# Patient Record
Sex: Female | Born: 1975
Health system: Southern US, Community
[De-identification: ages and names within clinical notes are randomized; demographics above are authoritative.]

## PROBLEM LIST (undated history)

## (undated) DIAGNOSIS — G43909 Migraine, unspecified, not intractable, without status migrainosus: Secondary | ICD-10-CM

## (undated) DIAGNOSIS — D649 Anemia, unspecified: Secondary | ICD-10-CM

## (undated) HISTORY — DX: Anemia, unspecified: D64.9

## (undated) HISTORY — DX: Migraine, unspecified, not intractable, without status migrainosus: G43.909

---

## 2011-01-18 ENCOUNTER — Ambulatory Visit (INDEPENDENT_AMBULATORY_CARE_PROVIDER_SITE_OTHER): Payer: Managed Care, Other (non HMO)

## 2011-01-18 DIAGNOSIS — R059 Cough, unspecified: Secondary | ICD-10-CM

## 2011-01-18 DIAGNOSIS — J029 Acute pharyngitis, unspecified: Secondary | ICD-10-CM

## 2011-01-18 DIAGNOSIS — R21 Rash and other nonspecific skin eruption: Secondary | ICD-10-CM

## 2011-01-18 DIAGNOSIS — R05 Cough: Secondary | ICD-10-CM

## 2011-10-10 ENCOUNTER — Telehealth: Payer: Self-pay | Admitting: Gynecology

## 2011-10-10 ENCOUNTER — Encounter: Payer: Self-pay | Admitting: Women's Health

## 2011-10-10 ENCOUNTER — Other Ambulatory Visit (HOSPITAL_COMMUNITY)
Admission: RE | Admit: 2011-10-10 | Discharge: 2011-10-10 | Disposition: A | Discharge status: Home / Self Care | Payer: Managed Care, Other (non HMO) | Source: Ambulatory Visit | Attending: Obstetrics and Gynecology | Admitting: Obstetrics and Gynecology

## 2011-10-10 ENCOUNTER — Other Ambulatory Visit: Payer: Self-pay | Admitting: Gynecology

## 2011-10-10 ENCOUNTER — Ambulatory Visit (INDEPENDENT_AMBULATORY_CARE_PROVIDER_SITE_OTHER): Payer: Managed Care, Other (non HMO) | Admitting: Women's Health

## 2011-10-10 VITALS — BP 122/72 | Ht 65.0 in | Wt 137.0 lb

## 2011-10-10 DIAGNOSIS — Z113 Encounter for screening for infections with a predominantly sexual mode of transmission: Secondary | ICD-10-CM

## 2011-10-10 DIAGNOSIS — Z3049 Encounter for surveillance of other contraceptives: Secondary | ICD-10-CM

## 2011-10-10 DIAGNOSIS — Z13220 Encounter for screening for lipoid disorders: Secondary | ICD-10-CM

## 2011-10-10 DIAGNOSIS — Z833 Family history of diabetes mellitus: Secondary | ICD-10-CM

## 2011-10-10 DIAGNOSIS — Z01419 Encounter for gynecological examination (general) (routine) without abnormal findings: Secondary | ICD-10-CM | POA: Insufficient documentation

## 2011-10-10 LAB — CBC WITH DIFFERENTIAL/PLATELET
Lymphocytes Relative: 36 % (ref 12–46)
Lymphs Abs: 1.7 10*3/uL (ref 0.7–4.0)
Neutrophils Relative %: 52 % (ref 43–77)
Platelets: 288 10*3/uL (ref 150–400)
RBC: 4.7 MIL/uL (ref 3.87–5.11)
WBC: 4.7 10*3/uL (ref 4.0–10.5)

## 2011-10-10 LAB — LIPID PANEL
HDL: 72 mg/dL (ref >39)
Triglycerides: 49 mg/dL (ref <150)

## 2011-10-10 LAB — GLUCOSE, RANDOM: Glucose, Bld: 75 mg/dL (ref 70–99)

## 2011-10-10 LAB — HIV ANTIBODY (ROUTINE TESTING W REFLEX): HIV: NONREACTIVE

## 2011-10-10 MED ORDER — LEVONORGESTREL 20 MCG/24HR IU IUD: INTRAUTERINE_SYSTEM | Freq: Once | INTRAUTERINE | Status: DC

## 2011-10-10 NOTE — Telephone Encounter (Signed)
MIRENA BENEFITS:Patient was advised that Mirena & insertion are both covered at 100% with no copay. She already has appt with JF/[facility]

## 2011-10-10 NOTE — Progress Notes (Signed)
Mary Hamilton January 14, 1976 [id number]    History:    The patient presents for annual exam.  Monthly cycle for 5 days/condoms. One partner for 6 months. Complained of headaches when used OCPs in the past. Requesting contraception. New patient, reports normal Pap history.   Past medical history, past surgical history, family history and social history were all reviewed and documented in the EPIC chart. 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   ROS:  A  ROS was performed and pertinent positives and negatives are included in the history.  Exam:  Filed Vitals:   10/10/11 1019  BP: 122/72    General appearance:  Normal Head/Neck:  Normal, without cervical or supraclavicular adenopathy. Thyroid:  Symmetrical, normal in size, without palpable masses or nodularity. Respiratory  Effort:  Normal  Auscultation:  Clear without wheezing or rhonchi Cardiovascular  Auscultation:  Regular rate, without rubs, murmurs or gallops  Edema/varicosities:  Not grossly evident Abdominal  Soft,nontender, without masses, guarding or rebound.  Liver/spleen:  No organomegaly noted  Hernia:  None appreciated  Skin  Inspection:  Grossly normal  Palpation:  Grossly normal Neurologic/psychiatric  Orientation:  Normal with appropriate conversation.  Mood/affect:  Normal  Genitourinary    Breasts: Examined lying and sitting.     Right: Without masses, retractions, discharge or axillary adenopathy.     Left: Without masses, retractions, discharge or axillary adenopathy.   Inguinal/mons:  Normal without inguinal adenopathy  External genitalia:  Normal  BUS/Urethra/Skene's glands:  Normal  Bladder:  Normal  Vagina:  Normal  Cervix:  Normal  Uterus:  normal in size, shape and contour.  Midline and mobile  Adnexa/parametria:     Rt: Without masses or tenderness.   Lt: Without masses or tenderness.  Anus and perineum: Normal  Digital rectal exam: Normal sphincter tone without palpated  masses or tenderness  Assessment/Plan:  36 y.o. SBF G2 P1 for annual exam with no complaints.  Normal GYN exam Contraception management STD screen  Plan: CBC, glucose, lipid panel, UA, Pap with HR HPV typing, GC/Chlamydia,  HIV, hep B and C., RPR. SBE's, exercise, calcium rich diet, MVI daily encouraged. Contraception options reviewed, Mirena IUD information given reviewed slight risk for perforation, hemorrhage, infection. Will schedule with next cycle with Dr. Lily Peer. Encouraged condoms until Mirena IUD placed.   Harrington Challenger [facility], 12:39 PM 10/10/2011

## 2011-10-10 NOTE — Patient Instructions (Addendum)
Health Maintenance, Females A healthy lifestyle and preventative care can promote health and wellness.  Maintain regular health, dental, and eye exams.   Eat a healthy diet. Foods like vegetables, fruits, whole grains, low-fat dairy products, and lean protein foods contain the nutrients you need without too many calories. Decrease your intake of foods high in solid fats, added sugars, and salt. Get information about a proper diet from your caregiver, if necessary.   Regular physical exercise is one of the most important things you can do for your health. Most adults should get at least 150 minutes of moderate-intensity exercise (any activity that increases your heart rate and causes you to sweat) each week. In addition, most adults need muscle-strengthening exercises on 2 or more days a week.    Maintain a healthy weight. The body mass index (BMI) is a screening tool to identify possible weight problems. It provides an estimate of body fat based on height and weight. Your caregiver can help determine your BMI, and can help you achieve or maintain a healthy weight. For adults 20 years and older:   A BMI below 18.5 is considered underweight.   A BMI of 18.5 to 24.9 is normal.   A BMI of 25 to 29.9 is considered overweight.   A BMI of 30 and above is considered obese.   Maintain normal blood lipids and cholesterol by exercising and minimizing your intake of saturated fat. Eat a balanced diet with plenty of fruits and vegetables. Blood tests for lipids and cholesterol should begin at age 20 and be repeated every 5 years. If your lipid or cholesterol levels are high, you are over 50, or you are a high risk for heart disease, you may need your cholesterol levels checked more frequently.Ongoing high lipid and cholesterol levels should be treated with medicines if diet and exercise are not effective.   If you smoke, find out from your caregiver how to quit. If you do not use tobacco, do not start.    If you are pregnant, do not drink alcohol. If you are breastfeeding, be very cautious about drinking alcohol. If you are not pregnant and choose to drink alcohol, do not exceed 1 drink per day. One drink is considered to be 12 ounces (355 mL) of beer, 5 ounces (148 mL) of wine, or 1.5 ounces (44 mL) of liquor.   Avoid use of street drugs. Do not share needles with anyone. Ask for help if you need support or instructions about stopping the use of drugs.   High blood pressure causes heart disease and increases the risk of stroke. Blood pressure should be checked at least every 1 to 2 years. Ongoing high blood pressure should be treated with medicines, if weight loss and exercise are not effective.   If you are 55 to 34 years old, ask your caregiver if you should take aspirin to prevent strokes.   Diabetes screening involves taking a blood sample to check your fasting blood sugar level. This should be done once every 3 years, after age 45, if you are within normal weight and without risk factors for diabetes. Testing should be considered at a younger age or be carried out more frequently if you are overweight and have at least 1 risk factor for diabetes.   Breast cancer screening is essential preventative care for women. You should practice "breast self-awareness." This means understanding the normal appearance and feel of your breasts and may include breast self-examination. Any changes detected, no matter how   small, should be reported to a caregiver. Women in their 20s and 30s should have a clinical breast exam (CBE) by a caregiver as part of a regular health exam every 1 to 3 years. After age 40, women should have a CBE every year. Starting at age 40, women should consider having a mammogram (breast X-ray) every year. Women who have a family history of breast cancer should talk to their caregiver about genetic screening. Women at a high risk of breast cancer should talk to their caregiver about having  an MRI and a mammogram every year.   The Pap test is a screening test for cervical cancer. Women should have a Pap test starting at age 21. Between ages 21 and 29, Pap tests should be repeated every 2 years. Beginning at age 30, you should have a Pap test every 3 years as long as the past 3 Pap tests have been normal. If you had a hysterectomy for a problem that was not cancer or a condition that could lead to cancer, then you no longer need Pap tests. If you are between ages 65 and 70, and you have had normal Pap tests going back 10 years, you no longer need Pap tests. If you have had past treatment for cervical cancer or a condition that could lead to cancer, you need Pap tests and screening for cancer for at least 20 years after your treatment. If Pap tests have been discontinued, risk factors (such as a new sexual partner) need to be reassessed to determine if screening should be resumed. Some women have medical problems that increase the chance of getting cervical cancer. In these cases, your caregiver may recommend more frequent screening and Pap tests.   The human papillomavirus (HPV) test is an additional test that may be used for cervical cancer screening. The HPV test looks for the virus that can cause the cell changes on the cervix. The cells collected during the Pap test can be tested for HPV. The HPV test could be used to screen women aged 30 years and older, and should be used in women of any age who have unclear Pap test results. After the age of 30, women should have HPV testing at the same frequency as a Pap test.   Colorectal cancer can be detected and often prevented. Most routine colorectal cancer screening begins at the age of 50 and continues through age 75. However, your caregiver may recommend screening at an earlier age if you have risk factors for colon cancer. On a yearly basis, your caregiver may provide home test kits to check for hidden blood in the stool. Use of a small camera at  the end of a tube, to directly examine the colon (sigmoidoscopy or colonoscopy), can detect the earliest forms of colorectal cancer. Talk to your caregiver about this at age 50, when routine screening begins. Direct examination of the colon should be repeated every 5 to 10 years through age 75, unless early forms of pre-cancerous polyps or small growths are found.   Hepatitis C blood testing is recommended for all people born from 1945 through 1965 and any individual with known risks for hepatitis C.   Practice safe sex. Use condoms and avoid high-risk sexual practices to reduce the spread of sexually transmitted infections (STIs). Sexually active women aged 25 and younger should be checked for Chlamydia, which is a common sexually transmitted infection. Older women with new or multiple partners should also be tested for Chlamydia. Testing for other   STIs is recommended if you are sexually active and at increased risk.   Osteoporosis is a disease in which the bones lose minerals and strength with aging. This can result in serious bone fractures. The risk of osteoporosis can be identified using a bone density scan. Women ages 65 and over and women at risk for fractures or osteoporosis should discuss screening with their caregivers. Ask your caregiver whether you should be taking a calcium supplement or vitamin D to reduce the rate of osteoporosis.   Menopause can be associated with physical symptoms and risks. Hormone replacement therapy is available to decrease symptoms and risks. You should talk to your caregiver about whether hormone replacement therapy is right for you.   Use sunscreen with a sun protection factor (SPF) of 30 or greater. Apply sunscreen liberally and repeatedly throughout the day. You should seek shade when your shadow is shorter than you. Protect yourself by wearing long sleeves, pants, a wide-brimmed hat, and sunglasses year round, whenever you are outdoors.   Notify your caregiver  of new moles or changes in moles, especially if there is a change in shape or color. Also notify your caregiver if a mole is larger than the size of a pencil eraser.   Stay current with your immunizations.  Document Released: 07/22/2010 Document Revised: 12/26/2010 Document Reviewed: 07/22/2010 ExitCare Patient Information 2012 [employer].HPV Vaccine Questions and Answers WHAT IS HUMAN PAPILLOMAVIRUS (HPV)? HPV is a virus that can lead to cervical cancer; vulvar and vaginal cancers; penile cancer; anal cancer and genital warts (warts in the genital areas). More than 1 vaccine is available to help you or your child with protection against HPV. Your caregiver can talk to you about which one might give you the best protection. WHO SHOULD GET THIS VACCINE? The HPV vaccine is most effective when given before the onset of sexual activity.  This vaccine is recommended for girls 11 or 34 years of age. It can be given to girls as Mary Hamilton as 34 years old.   HPV vaccine can be given to males, 9 through 34 years of age, to reduce the likelihood of acquiring genital warts.   HPV vaccine can be given to males and females aged 9 through 26 years to prevent anal cancer.  HPV vaccine is not generally recommended after age 26, because most individuals have been exposed to the HPV virus by that age. HOW EFFECTIVE IS THIS VACCINE?  The vaccine is generally effective in preventing cervical; vulvar and vaginal cancers; penile cancer; anal cancer and genital warts caused by 4 types of HPV. The vaccine is less effective in those individuals who are already infected with HPV. This vaccine does not treat existing HPV, genital warts, pre-cancers or cancers. WILL SEXUALLY ACTIVE INDIVIDUALS BENEFIT FROM THE VACCINE? Sexually active individuals may still benefit from the vaccine but may get less benefit due to previous HPV exposure. HOW AND WHEN IS THE VACCINE ADMINISTERED? The vaccine is given in a series of 3  injections (shots) over a 6 month period in both males and females. The exact timing depends on which specific vaccine your caregiver recommends for you. IS THE HPV VACCINE SAFE?  The federal government has approved the HPV vaccine as safe and effective. This vaccine was tested in both males and females in many countries around the world. The most common side effect is soreness at the injection site. Since the drug became approved, there has been some concern about patients passing out after being vaccinated, which has   led to a recommendation of a 15 minute waiting period following vaccination. This practice may decrease the small risk of passing out. Additionally there is a rare risk of anaphylaxis (an allergic reaction) to the vaccine and a risk of a blood clot among individuals with specific risk factors for a blood clot. DOES THIS VACCINE CONTAIN THIMEROSAL OR MERCURY? No. There is no thimerosal or mercury in the HPV vaccine. It is made of proteins from the outer coat of the virus (HPV). There is no infectious material in this vaccine. WILL GIRLS/WOMEN WHO HAVE BEEN VACCINATED STILL NEED CERVICAL CANCER SCREENING? Yes. There are 3 reasons why women will still need regular cervical cancer screening. First, the vaccine will NOT provide protection against all types of HPV that cause cervical cancer. Vaccinated women will still be at risk for some cancers. Second, some women may not get all required doses of the vaccine (or they may not get them at the recommended times). Therefore, they may not get the vaccine's full benefits. Third, women may not get the full benefit of the vaccine if they receive it after they have already acquired any of the 4 types of HPV. WILL THE HPV VACCINE BE COVERED BY INSURANCE PLANS? While some insurance companies may cover the vaccine, others may not. Most large group insurance plans cover the costs of recommended vaccines. WHAT KIND OF GOVERNMENT PROGRAMS MAY BE AVAILABLE TO  COVER HPV VACCINE? Federal health programs such as Vaccines for Children (VFC) will cover the HPV vaccine. The VFC program provides free vaccines to children and adolescents under 19 years of age, who are either uninsured, Medicaid-eligible, [place] or [place]. There are over 45,000 sites that provide VFC vaccines including hospital, private and public clinics. The VFC program also allows children and adolescents to get VFC vaccines through Federally Qualified Health Centers or Rural Health Centers if their private health insurance does not cover the vaccine. Some states also provide free or low-cost vaccines, at public health clinics, to people without health insurance coverage for vaccines. GENITAL HPV: WHY IS HPV IMPORTANT? Genital HPV is the most common virus transmitted through genital contact, most often during vaginal and anal sex. About 40 types of HPV can infect the genital areas of men and women. While most HPV types cause no symptoms and go away on their own, some types can cause cervical cancer in women. These types also cause other less common genital cancers, including cancers of the penis, anus, vagina (birth canal), and vulva (area around the opening of the vagina). Other types of HPV can cause genital warts in men and women. HOW COMMON IS HPV?   At least 50% of sexually active people will get HPV at some time in their lives. HPV is most common in Mary Hamilton women and men who are in their late teens and early 20s.   Anyone who has ever had genital contact with another person can get HPV. Both men and women can get it and pass it on to their sex partners without realizing it.  IS HPV THE SAME THING AS HIV OR HERPES? HPV is NOT the same as HIV or Herpes (Herpes simplex virus or HSV). While these are all viruses that can be sexually transmitted, HIV and HSV do not cause the same symptoms or health problems as HPV. CAN HPV AND ITS ASSOCIATED DISEASES BE TREATED? There is no treatment  for HPV. There are treatments for the health problems that HPV can cause, such as genital warts, cervical   cell changes, and cancers of the cervix (lower part of the womb), vulva, vagina and anus.  HOW IS HPV RELATED TO CERVICAL CANCER? Some types of HPV can infect a woman's cervix and cause the cells to change in an abnormal way. Most of the time, HPV goes away on its own. When HPV is gone, the cervical cells go back to normal. Sometimes, HPV does not go away. Instead, it lingers (persists) and continues to change the cells on a woman's cervix. These cell changes can lead to cancer over time if they are not treated. ARE THERE OTHER WAYS TO PREVENT CERVICAL CANCER? Regular Pap tests and follow-up can prevent most, but not all, cases of cervical cancer. Pap tests can detect cell changes (or pre-cancers) in the cervix before they turn into cancer. Pap tests can also detect most, but not all, cervical cancers at an early, curable stage. Most women diagnosed with cervical cancer have either never had a Pap test, or not had a Pap test in the last 5 years. There is also an HPV DNA test available for use with the Pap test as part of cervical cancer screening. This test may be ordered for women over 30 or for women who get an unclear (borderline) Pap test result. While this test can tell if a woman has HPV on her cervix, it cannot tell which types of HPV she has. If the HPV DNA test is negative for HPV DNA, then screening may be done every 3 years. If the HPV DNA test is positive for HPV DNA, then screening should be done every 6 to 12 months. OTHER QUESTIONS ABOUT THE HPV VACCINE WHAT HPV TYPES DOES THE VACCINE PROTECT AGAINST? The HPV vaccine protects against the HPV types that cause most (70%) cervical cancers (types 16 and 18), most (78%) anal cancers (types 16 and 18) and the two HPV types that cause most (90%) genital warts (types 6 and 11). WHAT DOES THE VACCINE NOT PROTECT AGAINST?  Because the vaccine  does not protect against all types of HPV, it will not prevent all cases of cervical cancer, anal cancer, other genital cancers or genital warts. About 30% of cervical cancers are not prevented with vaccination, so it will be important for women to continue screening for cervical cancer (regular Pap tests). Also, the vaccine does not prevent about 10% of genital warts nor will it prevent other sexually transmitted infections (STIs), including HIV. Therefore, it will still be important for sexually active adults to practice safe sex to reduce exposure to HPV and other STI's. HOW LONG DOES VACCINE PROTECTION LAST? WILL A BOOSTER SHOT BE NEEDED? So far, studies have followed women for 5 years and found that they are still protected. Currently, additional (booster) doses are not recommended. More research is being done to find out how long protection will last, and if a booster vaccine is needed years later.  WHY IS THE HPV VACCINE RECOMMENDED AT SUCH A Mary Hamilton AGE? Ideally, males and females should get the vaccine before they are sexually active since this vaccine is most effective in individuals who have not yet acquired any of the HPV vaccine types. Individuals who have not been infected with any of the 4 types of HPV will get the full benefits of the vaccine.  SHOULD PREGNANT WOMEN BE VACCINATED? The vaccine is not recommended for pregnant women. There has been limited research looking at vaccine safety for pregnant women and their developing fetus. Studies suggest that the vaccine has not caused   health problems during pregnancy, nor has it caused health problems for the infant. Pregnant women should complete their pregnancy before getting the vaccine. If a woman finds out she is pregnant after she has started getting the vaccine series, she should complete her pregnancy before finishing the 3 doses. SHOULD BREASTFEEDING MOTHERS BE VACCINATED? Mothers nursing their babies may get the vaccine because the virus  is inactivated and will not harm the mother or baby. WILL INDIVIDUALS BE PROTECTED AGAINST HPV AND RELATED DISEASES, EVEN IF THEY DO NOT GET ALL 3 DOSES? It is not yet known how much protection individuals will get from receiving only 1 or 2 doses of the vaccine. For this reason, it is very important that individuals get all 3 doses of the vaccine. WILL CHILDREN BE REQUIRED TO BE VACCINATED TO ENTER SCHOOL? There are no federal laws that require children or adolescents to get vaccinated. All school entry laws are state laws so they vary from state to state. To find out what vaccines are needed for children or adolescents to enter school in your state, check with your state health department or board of education. ARE THERE OTHER WAYS TO PREVENT HPV? The only sure way to prevent HPV is to abstain from all sexual activity. Sexually active adults can reduce their risk by being in a mutually monogamous relationship with someone who has had no other sex partners. But even individuals with only 1 lifetime sex partner can get HPV, if their partner has had a previous partner with HPV. It is unknown how much protection condoms provide against HPV, since areas that are not covered by a condom can be exposed to the virus. However, condoms may reduce the risk of genital warts and cervical cancer. They can also reduce the risk of HIV and some other sexually transmitted infections (STIs), when used consistently and correctly (all the time and the right way). Document Released: 01/06/2005 Document Revised: 12/26/2010 Document Reviewed: 09/01/2008 ExitCare Patient Information 2012 [employer]. 

## 2011-10-11 LAB — URINALYSIS W MICROSCOPIC + REFLEX CULTURE
Casts: NONE SEEN
Glucose, UA: NEGATIVE mg/dL
Hgb urine dipstick: NEGATIVE
Ketones, ur: NEGATIVE mg/dL
Leukocytes, UA: NEGATIVE
pH: 6.5 (ref 5.0–8.0)

## 2011-10-11 LAB — GC/CHLAMYDIA PROBE AMP, GENITAL: Chlamydia, DNA Probe: NEGATIVE

## 2011-10-11 LAB — RPR

## 2011-10-21 ENCOUNTER — Ambulatory Visit: Payer: Managed Care, Other (non HMO) | Admitting: Gynecology

## 2011-10-22 ENCOUNTER — Encounter: Payer: Self-pay | Admitting: Gynecology

## 2011-10-22 ENCOUNTER — Other Ambulatory Visit: Payer: Self-pay | Admitting: Gynecology

## 2011-10-22 ENCOUNTER — Ambulatory Visit (INDEPENDENT_AMBULATORY_CARE_PROVIDER_SITE_OTHER): Payer: Managed Care, Other (non HMO) | Admitting: Gynecology

## 2011-10-22 VITALS — BP 110/70

## 2011-10-22 DIAGNOSIS — Z30430 Encounter for insertion of intrauterine contraceptive device: Secondary | ICD-10-CM

## 2011-10-22 DIAGNOSIS — G43909 Migraine, unspecified, not intractable, without status migrainosus: Secondary | ICD-10-CM

## 2011-10-22 DIAGNOSIS — Z3049 Encounter for surveillance of other contraceptives: Secondary | ICD-10-CM

## 2011-10-22 MED ORDER — ELETRIPTAN HYDROBROMIDE 40 MG PO TABS: ORAL_TABLET | ORAL | Status: DC

## 2011-10-22 NOTE — Patient Instructions (Signed)
Intrauterine Device Information  An intrauterine device (IUD) is inserted into your uterus and prevents pregnancy. There are 2 types of IUDs available:  · Copper IUD. This type of IUD is wrapped in copper wire and is placed inside the uterus. Copper makes the uterus and fallopian tubes produce a fluid that kills sperm. The copper IUD can stay in place for 10 years.  · Hormone IUD. This type of IUD contains the hormone progestin (synthetic progesterone). The hormone thickens the cervical mucus and prevents sperm from entering the uterus, and it also thins the uterine lining to prevent implantation of a fertilized egg. The hormone can weaken or kill the sperm that get into the uterus. The hormone IUD can stay in place for 5 years.  Your caregiver will make sure you are a good candidate for a contraceptive IUD. Discuss with your caregiver the possible side effects.  ADVANTAGES  · It is highly effective, reversible, long-acting, and low maintenance.  · There are no estrogen-related side effects.  · An IUD can be used when breastfeeding.  · It is not associated with weight gain.  · It works immediately after insertion.  · The copper IUD does not interfere with your female hormones.  · The progesterone IUD can make heavy menstrual periods lighter.  · The progesterone IUD can be used for 5 years.  · The copper IUD can be used for 10 years.  DISADVANTAGES  · The progesterone IUD can be associated with irregular bleeding patterns.  · The copper IUD can make your menstrual flow heavier and more painful.  · You may experience cramping and vaginal bleeding after insertion.  Document Released: 12/11/2003 Document Revised: 03/31/2011 Document Reviewed: 05/11/2010  ExitCare® Patient Information ©2013 [employer].

## 2011-10-22 NOTE — Progress Notes (Signed)
Patient is a 36 year old presented to the office today for placement of an IUD. Patient suffers from migraines and oral contraceptive pills exacerbate her headaches. She was originally scheduled for a Mirena IUD but after further discussion whether I think that she would benefit more from a nonhormonal IUD such as the ParaGard T380A IUD. Literature information was provided she read in the office the risks benefits and pros and cons were discussed. She is fully where this form of contraception is 99% effective and is good for 10 years. Patient a few weeks ago had her annual exam here in the office in her GC and Chlamydia culture and Pap smear were negative. Patient denies any prior history of STDs. She has one child.  Procedure note: Abdomen: Soft nontender no rebound or guarding Vagina: No lesions or discharge only menstrual blood was noted Cervix: Menstrual blood noted but no active bleeding Uterus: Anteverted normal size shape and consistency Adnexa: No palpable masses or tenderness Rectal exam: Not done  The cervix was cleansed with Betadine solution. A tenaculum was placed on the anterior cervical lip. The uterus sounded to 7 cm. The ParaGard T380A IUD was placed in sterile fashion and the string was cut the single-tooth tenaculum was removed. There was some slight oozing from one of the tenaculum sites which was contained with silver nitrate. Patient tolerated procedure well. Patient scheduled to return back in one month for followup. Patient was complaining of a migraine headache today she was given a sample of Relpax 40 mg to take 1 by mouth today. Prescriptions were e- prescribed.

## 2011-10-22 NOTE — Progress Notes (Signed)
Patient is in the office and Dr. Glenetta Hew has recommended Paraguard IUD instead of Mirena.  I was asked to check benefits.  I was quoted that Paraguard covered 100% after a $50 co-payment.  Patient had been previously told it was covered 100%.  I asked Elane Fritz to make the patient aware that she may receive bill for the co-payment amount.

## 2011-10-23 ENCOUNTER — Telehealth: Payer: Self-pay | Admitting: *Deleted

## 2011-10-23 MED ORDER — ELETRIPTAN HYDROBROMIDE 40 MG PO TABS: 40.0000 mg | ORAL_TABLET | ORAL | Status: DC | PRN

## 2011-10-23 NOTE — Telephone Encounter (Signed)
Pt called stating insurance will not pay for her brand relpax 40 mg tablet. Pt is requesting generic brand. rx sent.

## 2011-10-24 ENCOUNTER — Encounter: Payer: Self-pay | Admitting: Gynecology

## 2011-10-24 ENCOUNTER — Ambulatory Visit (INDEPENDENT_AMBULATORY_CARE_PROVIDER_SITE_OTHER): Payer: Managed Care, Other (non HMO)

## 2011-10-24 ENCOUNTER — Other Ambulatory Visit: Payer: Self-pay | Admitting: Gynecology

## 2011-10-24 ENCOUNTER — Ambulatory Visit (INDEPENDENT_AMBULATORY_CARE_PROVIDER_SITE_OTHER): Payer: Managed Care, Other (non HMO) | Admitting: Gynecology

## 2011-10-24 VITALS — BP 120/74

## 2011-10-24 DIAGNOSIS — N949 Unspecified condition associated with female genital organs and menstrual cycle: Secondary | ICD-10-CM

## 2011-10-24 DIAGNOSIS — R102 Pelvic and perineal pain: Secondary | ICD-10-CM

## 2011-10-24 DIAGNOSIS — N719 Inflammatory disease of uterus, unspecified: Secondary | ICD-10-CM | POA: Insufficient documentation

## 2011-10-24 DIAGNOSIS — Z975 Presence of (intrauterine) contraceptive device: Secondary | ICD-10-CM

## 2011-10-24 MED ORDER — DOXYCYCLINE HYCLATE 100 MG PO CAPS: 100.0000 mg | ORAL_CAPSULE | Freq: Two times a day (BID) | ORAL | Status: DC

## 2011-10-24 MED ORDER — SUMATRIPTAN SUCCINATE 50 MG PO TABS: ORAL_TABLET | ORAL | Status: DC

## 2011-10-24 NOTE — Progress Notes (Signed)
Patient is a 36 year old who was seen in the office on October 2 for placement of a para-guard T380A IUD.Patient suffers from migraines and oral contraceptive pills exacerbate her headaches. She was originally scheduled for a Mirena IUD but after further discussion whether I think that she would benefit more from a nonhormonal IUD such as the ParaGard T380A IUD. The IUD was placed without any problems it was during her menses. She stated that yesterday she was feeling fine but that today she started having a lot of cramping and right-sided lower, we'll discomfort. She was asked to come in for an ultrasound and examination and to confirm that the IUD was in appropriate place.  Exam: Abdomen: Soft nontender no rebound or guarding Pelvic: Bartholin urethra Skene glands within normal limits Vagina: Some menstrual blood present Cervix: IUD string seen Uterus: Anteverted normal size shape and consistency Adnexa: No palpable masses or tenderness Rectal exam: Not done  Ultrasound: Uterus measured 8.5 x 5.6 x 4.1 cm endometrial stripe 4.5 mm. IUD was seen in the endometrial cavity. Normal-appearing uterus and ovaries.  Assessment/plan: Patient is post placement of ParaGard T380A IUD with cramping on second day. When she arrived to the office she had stated that she had taken an Aleve and her symptoms resolved. The ultrasound confirmed the IUD in the proper position. Patient has been in a monogamous relationship. We will give her Vibramycin 100 mg twice a day for one week in the event of of an endometritis. She was reassured otherwise she will follow up in one month or when necessary.

## 2011-10-24 NOTE — Patient Instructions (Signed)
Endometritis  Endometritis is an irritation, soreness, and swelling (inflammation) of the lining of the uterus (endometrium).   CAUSES    Bacterial infections.   Sexually transmitted infections (STIs).   Having a miscarriage or childbirth, especially after a long labor or cesarean delivery.   Certain gynecological procedures (D&C, hysteroscopy, or contraceptive insertion).  SYMPTOMS    Fever.   Lower abdominal or pelvic pain.   Abnormal vaginal discharge or bleeding.   Abdominal bloating (distention) or swelling.   General discomfort or ill feeling.   Discomfort with bowel movements.  DIAGNOSIS   A physical and pelvic exam are performed. Other tests may include:   Cultures from the cervix.   Blood tests.   Examining a tissue sample of the uterine lining (endometrial biopsy).   Examining discharge under a microscope (wet prep).   Laparoscopy.  TREATMENT   Antibiotic medicines are usually given. Other treatments may include:   Fluids through the vein (IV fluids).   Rest.  HOME CARE INSTRUCTIONS    Take over-the-counter or prescription medicines for pain, discomfort, or fever as directed by your caregiver.   Resume your normal diet and activities as directed or as tolerated.   Do not douche or have sexual intercourse until your caregiver says it is okay.   Take your antibiotics as directed. Finish them even if you start to feel better.   Do not have sexual intercourse until your partner has been treated if your endometritis is caused by an STI.  SEEK IMMEDIATE MEDICAL CARE IF:    You have swelling or increasing pain in the abdomen.   You have a fever.   You have bad smelling vaginal discharge, or you have an increased amount of discharge.   You have abnormal vaginal bleeding.   Your medicine is not helping with the pain.   You experience any problems that may be related to the medicine you are taking.   You have nausea and vomiting, or you cannot keep foods down.   You have pain with bowel  movements.  MAKE SURE YOU:    Understand these instructions.   Will watch your condition.   Will get help right away if you are not doing well or get worse.  Document Released: 12/31/2000 Document Revised: 03/31/2011 Document Reviewed: 08/05/2010  Exit[employer] Patient Information 2013 [employer].

## 2011-11-19 ENCOUNTER — Ambulatory Visit: Payer: Managed Care, Other (non HMO) | Admitting: Gynecology

## 2011-11-21 ENCOUNTER — Ambulatory Visit (INDEPENDENT_AMBULATORY_CARE_PROVIDER_SITE_OTHER): Payer: Managed Care, Other (non HMO) | Admitting: Gynecology

## 2011-11-21 ENCOUNTER — Encounter: Payer: Self-pay | Admitting: Gynecology

## 2011-11-21 VITALS — BP 110/70

## 2011-11-21 DIAGNOSIS — G43909 Migraine, unspecified, not intractable, without status migrainosus: Secondary | ICD-10-CM | POA: Insufficient documentation

## 2011-11-21 DIAGNOSIS — Z30431 Encounter for routine checking of intrauterine contraceptive device: Secondary | ICD-10-CM

## 2011-11-21 DIAGNOSIS — Z23 Encounter for immunization: Secondary | ICD-10-CM

## 2011-11-21 NOTE — Patient Instructions (Addendum)

## 2011-11-21 NOTE — Progress Notes (Signed)
Patient presented to the office today for her one month followup. She was seen the office October 4 where she had a ParaGard T380A IUD placed. Patient is doing well but would like have her string trimmed.  Exam: Bartholin urethra Skene was within normal limits Vagina: Menstrual blood present Cervix: IUD string seen and trimmed Uterus: Anteverted normal size shape and consistency Adnexa: No palpable masses or tenderness Rectal exam: Deferred  Assessment/plan: Patient status post placement of ParaGard T380A IUD one month ago doing well. Patient scheduled to return back next year for her annual exam or when necessary. She received a Tdap vaccine today. Literature information on Tdap was provided.

## 2012-01-29 ENCOUNTER — Ambulatory Visit (INDEPENDENT_AMBULATORY_CARE_PROVIDER_SITE_OTHER): Payer: Managed Care, Other (non HMO) | Admitting: Women's Health

## 2012-01-29 DIAGNOSIS — Z113 Encounter for screening for infections with a predominantly sexual mode of transmission: Secondary | ICD-10-CM

## 2012-01-29 NOTE — Progress Notes (Signed)
Patient ID: Mary Hamilton, female   DOB: [birth year], 37 y.o.   MRN: [id number] Presents for STD check, asymptomatic, regular monthly cycle with ParaGard IUD placed 11/2011 per Dr. Lily Peer. Partner unfaithful. Denies vaginal discharge, urinary symptoms, abdominal pain or fever.  Exam: Appears well, external genitalia within normal limits, speculum exam cervix pink, IUD strings at os, no discharge, GC/Chlamydia culture taken. Bimanual no CMT or adnexal fullness or tenderness.  STD screen  Plan: GC/Chlamydia culture pending, Condoms encouraged when sexually active. Declines HIV, hepatitis or RPR today.

## 2012-01-30 LAB — GC/CHLAMYDIA PROBE AMP: GC Probe RNA: NEGATIVE

## 2012-02-15 ENCOUNTER — Other Ambulatory Visit: Payer: Self-pay

## 2012-02-16 ENCOUNTER — Emergency Department (HOSPITAL_COMMUNITY)
Admission: EM | Admit: 2012-02-16 | Discharge: 2012-02-17 | Disposition: A | Discharge status: Home / Self Care | Payer: Managed Care, Other (non HMO) | Attending: Emergency Medicine | Admitting: Emergency Medicine

## 2012-02-16 ENCOUNTER — Encounter (HOSPITAL_COMMUNITY): Payer: Self-pay | Admitting: *Deleted

## 2012-02-16 ENCOUNTER — Other Ambulatory Visit: Payer: Self-pay

## 2012-02-16 ENCOUNTER — Emergency Department (HOSPITAL_COMMUNITY): Payer: Managed Care, Other (non HMO)

## 2012-02-16 DIAGNOSIS — R079 Chest pain, unspecified: Secondary | ICD-10-CM | POA: Insufficient documentation

## 2012-02-16 DIAGNOSIS — G43909 Migraine, unspecified, not intractable, without status migrainosus: Secondary | ICD-10-CM | POA: Insufficient documentation

## 2012-02-16 LAB — POCT I-STAT, CHEM 8
BUN: 3 mg/dL — ABNORMAL LOW (ref 6–23)
Chloride: 107 mEq/L (ref 96–112)
Creatinine, Ser: 0.8 mg/dL (ref 0.50–1.10)
Potassium: 3.4 mEq/L — ABNORMAL LOW (ref 3.5–5.1)
Sodium: 142 mEq/L (ref 135–145)

## 2012-02-16 LAB — CBC WITH DIFFERENTIAL/PLATELET
Eosinophils Relative: 4 % (ref 0–5)
HCT: 35 % — ABNORMAL LOW (ref 36.0–46.0)
Hemoglobin: 11.4 g/dL — ABNORMAL LOW (ref 12.0–15.0)
Lymphocytes Relative: 44 % (ref 12–46)
MCV: 84.3 fL (ref 78.0–100.0)
Monocytes Absolute: 0.6 10*3/uL (ref 0.1–1.0)
Monocytes Relative: 9 % (ref 3–12)
Neutro Abs: 2.6 10*3/uL (ref 1.7–7.7)
WBC: 6.1 10*3/uL (ref 4.0–10.5)

## 2012-02-16 LAB — POCT I-STAT TROPONIN I

## 2012-02-16 MED ORDER — KETOROLAC TROMETHAMINE 30 MG/ML IJ SOLN
30.0000 mg | Freq: Once | INTRAMUSCULAR | Status: AC
  Administered 2012-02-17: 30 mg via INTRAVENOUS
  Filled 2012-02-16 (×2): qty 1

## 2012-02-16 NOTE — ED Notes (Signed)
Pt reports left upper chest pain radiating down left arm and to back since 1800- pt reports she feels nauseated- pain intermittant

## 2012-02-16 NOTE — ED Notes (Signed)
Patient c/o chest pain that started about 8pm and also a headache since this AM.  Patient has a history of migraines and took her migraine medication at 1630 which helped her headache.  Patient has been nauseated and gagging all day, not vomiting.

## 2012-02-17 MED ORDER — NAPROXEN 500 MG PO TABS: 500.0000 mg | ORAL_TABLET | Freq: Two times a day (BID) | ORAL | Status: DC

## 2012-02-17 NOTE — ED Notes (Signed)
Pt reports having hx of migraines, but never had chest pain like this before.

## 2012-02-17 NOTE — ED Provider Notes (Signed)
History     CSN: [id number]  Arrival date & time 02/16/12  2121   First MD Initiated Contact with Patient 02/16/12 2309      Chief Complaint  Patient presents with  . Chest Pain     The history is provided by the patient.   patient reports history of migraine headaches.  She felt like today she was having a migraine headache a normal external weakness of her left upper extremity and numbness as she has a history of complicated migraines but today she had some anterior left chest pain which brought her to the emergency department.  Her chest pain began this evening it radiated to her left arm.  She had no shortness of breath.  No nausea vomiting or diaphoresis.  She's never had exertional chest pain.  She has no history of hypertension, hyperlipidemia, diabetes, family history of early heart disease.  She does not smoke cigarettes.  She reports both her headache and her chest pain or improving on arrival to the emergency department.  She took her sumatriptan at home which seems to be helping her headache per the patient.  Her symptoms are mild to moderate in severity.  They're improving now.  Her pain is worsened by movement and palpation of her left chest.  No history of PE or DVT.  Past Medical History  Diagnosis Date  . Migraine headache     History reviewed. No pertinent past surgical history.  Family History  Problem Relation Age of Onset  . Diabetes Mother   . Migraines Father   . Migraines Sister   . Migraines Brother   . Diabetes Maternal Aunt   . Diabetes Maternal Uncle   . Diabetes Maternal Grandmother   . Diabetes Maternal Grandfather     History  Substance Use Topics  . Smoking status: Never Smoker   . Smokeless tobacco: Never Used  . Alcohol Use: No    OB History    Grav Para Term Preterm Abortions TAB SAB Ect Mult Living   2 1 1  1     1       Review of Systems  Cardiovascular: Positive for chest pain.  All other systems reviewed and are  negative.    Allergies  Review of patient's allergies indicates no known allergies.  Home Medications   Current Outpatient Rx  Name  Route  Sig  Dispense  Refill  . ACETAMINOPHEN 500 MG PO TABS   Oral   Take 1,000 mg by mouth every 6 (six) hours as needed. For pain.         . SUMATRIPTAN SUCCINATE 50 MG PO TABS   Oral   Take 50 mg by mouth every 2 (two) hours as needed. For migraines, may repeat once per 24 hours.         Marland Kitchen NAPROXEN 500 MG PO TABS   Oral   Take 1 tablet (500 mg total) by mouth 2 (two) times daily.   30 tablet   0     BP 115/80  Pulse 85  Temp 98.2 F (36.8 C) (Oral)  Resp 20  Ht 5\' 5"  (1.651 m)  Wt 130 lb (58.968 kg)  BMI 21.63 kg/m2  SpO2 100%  LMP 02/12/2012  Physical Exam  Nursing note and vitals reviewed. Constitutional: She is oriented to person, place, and time. She appears well-developed and well-nourished. No distress.  HENT:  Head: Normocephalic and atraumatic.  Eyes: EOM are normal.  Neck: Normal range of motion.  Cardiovascular:  Normal rate, regular rhythm and normal heart sounds.   Pulmonary/Chest: Effort normal and breath sounds normal.       Mild tenderness to anterior left superior chest wall without rash.  Abdominal: Soft. She exhibits no distension. There is no tenderness.  Musculoskeletal: Normal range of motion.  Neurological: She is alert and oriented to person, place, and time.  Skin: Skin is warm and dry.  Psychiatric: She has a normal mood and affect. Judgment normal.    ED Course  Procedures (including critical care time)  Labs Reviewed  CBC WITH DIFFERENTIAL - Abnormal; Notable for the following:    Hemoglobin 11.4 (*)     HCT 35.0 (*)     Neutrophils Relative 42 (*)     All other components within normal limits  POCT I-STAT, CHEM 8 - Abnormal; Notable for the following:    Potassium 3.4 (*)     BUN 3 (*)     All other components within normal limits  POCT I-STAT TROPONIN I   Dg Chest 2  View  02/16/2012  *RADIOLOGY REPORT*  Clinical Data: Chest pain  CHEST - 2 VIEW  Comparison: 01/18/2011  Findings: The cardiomediastinal silhouette is unremarkable. There is no evidence of focal airspace disease, pulmonary edema, suspicious pulmonary nodule/mass, pleural effusion, or pneumothorax. No acute bony abnormalities are identified.  IMPRESSION: No evidence of active cardiopulmonary disease.   Original Report Authenticated By: Harmon Pier, M.D.    I personally reviewed the imaging tests through PACS system I reviewed available ER/hospitalization records through the EMR   Date: 02/17/2012  Rate: 76  Rhythm: normal sinus rhythm  QRS Axis: normal  Intervals: normal  ST/T Wave abnormalities: normal  Conduction Disutrbances: none  Narrative Interpretation:   Old EKG Reviewed: no prior ecg available     1. Chest pain   2. Migraine       MDM  Migraine headache is improving on its own.  Her chest pain seems reproducible nature.  Doubt ACS.  Doubt PE.  EKG and labs normal.  His x-ray clear.  Vitals normal        Lyanne Co, MD 02/17/12 [phone]

## 2012-02-18 ENCOUNTER — Telehealth: Payer: Self-pay | Admitting: Women's Health

## 2012-02-18 NOTE — Telephone Encounter (Signed)
Telephone call for followup, states had a migraine that caused some left-sided arm pain as well as chest pain. Negative cardiac workup. Has a 35 year old daughter, breakup with long-term relationship, did encourage counseling, Mary Hamilton name and number given to discuss.

## 2012-03-06 ENCOUNTER — Other Ambulatory Visit: Payer: Self-pay

## 2012-04-15 ENCOUNTER — Other Ambulatory Visit: Payer: Self-pay | Admitting: Physician Assistant

## 2012-04-15 MED ORDER — OSELTAMIVIR PHOSPHATE 75 MG PO CAPS: 75.0000 mg | ORAL_CAPSULE | Freq: Two times a day (BID) | ORAL | Status: DC

## 2012-04-15 NOTE — Progress Notes (Signed)
Patient here with her daughter who is being seen for ILI. She requests ppx for influenza. Will go ahead and treat.

## 2012-07-16 ENCOUNTER — Ambulatory Visit (INDEPENDENT_AMBULATORY_CARE_PROVIDER_SITE_OTHER): Payer: Managed Care, Other (non HMO) | Admitting: Women's Health

## 2012-07-16 ENCOUNTER — Encounter: Payer: Self-pay | Admitting: Women's Health

## 2012-07-16 DIAGNOSIS — Z30432 Encounter for removal of intrauterine contraceptive device: Secondary | ICD-10-CM

## 2012-07-16 DIAGNOSIS — F411 Generalized anxiety disorder: Secondary | ICD-10-CM

## 2012-07-16 MED ORDER — ALPRAZOLAM 0.25 MG PO TABS: 0.2500 mg | ORAL_TABLET | Freq: Every evening | ORAL | Status: DC | PRN

## 2012-07-16 NOTE — Progress Notes (Signed)
Patient ID: Mary Hamilton, female   DOB: Apr 28, 1975, 37 y.o.   MRN: [id number] Presents to have ParaGard IUD removed. States since has been in periods have been longer, heavier, increased cramping, burning sensation, and states generally does not feel well. Not sexually active. Had problems with headaches when on OCs in the past. Denies vaginal discharge, urinary symptoms, abdominal pain. Requested anxiety medication for flying. Flying to [place]  to visit family next week.  Exam: Appears well. External genitalia within normal limits, speculum exam cervix healthy without discharge.  IUD strings visible, grasped, ParaGard IUD removed intact shown to patient and discarded. No visible bleeding, bimanual no CMT or adnexal fullness or tenderness.  Contraception management Situational Anxiety  Plan: Condoms encouraged if become sexually active or return to office for other management. Xanax 0.25 as needed for flights. Reviewed addictive properties, use sparingly.

## 2012-10-11 ENCOUNTER — Encounter: Payer: Managed Care, Other (non HMO) | Admitting: Women's Health

## 2012-10-19 ENCOUNTER — Encounter: Payer: Self-pay | Admitting: Women's Health

## 2012-10-27 ENCOUNTER — Ambulatory Visit (INDEPENDENT_AMBULATORY_CARE_PROVIDER_SITE_OTHER): Payer: Managed Care, Other (non HMO) | Admitting: Women's Health

## 2012-10-27 ENCOUNTER — Encounter: Payer: Self-pay | Admitting: Women's Health

## 2012-10-27 ENCOUNTER — Other Ambulatory Visit (HOSPITAL_COMMUNITY)
Admission: RE | Admit: 2012-10-27 | Discharge: 2012-10-27 | Disposition: A | Discharge status: Home / Self Care | Payer: Managed Care, Other (non HMO) | Source: Ambulatory Visit | Attending: Gynecology | Admitting: Gynecology

## 2012-10-27 VITALS — BP 114/74 | Ht 65.0 in | Wt 130.0 lb

## 2012-10-27 DIAGNOSIS — F411 Generalized anxiety disorder: Secondary | ICD-10-CM

## 2012-10-27 DIAGNOSIS — Z01419 Encounter for gynecological examination (general) (routine) without abnormal findings: Secondary | ICD-10-CM | POA: Insufficient documentation

## 2012-10-27 MED ORDER — DIAZEPAM 5 MG PO TABS: 5.0000 mg | ORAL_TABLET | Freq: Four times a day (QID) | ORAL | Status: DC | PRN

## 2012-10-27 NOTE — Addendum Note (Signed)
Addended by: Richardson Chiquito on: 10/27/2012 04:06 PM   Modules accepted: Orders

## 2012-10-27 NOTE — Progress Notes (Signed)
Mary Hamilton Mar 19, 1975 [id number]    History:    The patient presents for annual exam.  Has been fatigued for several months, saw PCP last month/ lab work showed mild anemia.  Taking iron supplements.  Paragard IUD removed 06/2012 due to increased bleeding/ cramping, now has regular cycles.  Same partner/ condoms.  Increased stress/ anxiety within past year from recent divorce.  Given xanax for flights, but did not use on flight to [place], had a panic attack during flight, stayed with sister in [place] at layover.  7 lb weight loss within year, attributes to increased anxiety/ depression/ working two jobs. Normal Pap history.  Past medical history, past surgical history, family history and social history were all reviewed and documented in the EPIC chart. 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  Exam:  Filed Vitals:   10/27/12 1033  BP: 114/74    General appearance:  Normal Head/Neck:  Normal, without cervical or supraclavicular adenopathy. Thyroid:  Symmetrical, normal in size, without palpable masses or nodularities Respiratory  Effort:  Normal  Auscultation:  Clear without wheezing or rhonchi Cardiovascular  Auscultation:  Regular rate, without rubs, murmurs or gallops  Edema/varicosities:  Not grossly evident Abdominal  Soft,nontender, without masses, guarding or rebound.  Liver/spleen:  No organomegaly noted  Hernia:  None appreciated  Skin  Inspection:  Grossly normal  Palpation:  Grossly normal Neurologic/psychiatric  Orientation:  Normal with appropriate conversation.  Mood/affect:  Normal  Genitourinary    Breasts: Examined lying and sitting.     Right: Without masses, retractions, discharge or axillary  adenopathy.     Left: Without masses, retractions, discharge or axillary adenopathy.   Inguinal/mons:  Normal without inguinal adenopathy  External genitalia:  Normal  BUS/Urethra/Skene's glands:  Normal  Bladder:  Normal  Vagina:  Normal  Cervix:  Normal  Uterus:  Normal in size, shape and contour.  Midline and mobile  Adnexa/parametria:     Rt: Without masses or tenderness.   Lt: Without masses or tenderness.  Anus and perineum: Normal  Digital rectal exam: Normal sphincter tone without palpated masses or tenderness  Assessment/Plan:  37 y.o. DHF for annual exam G1P1.  Normal Gynecological Exam Situational anxiety Anxiety  Anemia - primary care managing   Plan: Switch to Valium 5 mg po q 6 hrs as needed for flights.  Reviewed addictive properties, use sparingly.  Continue iron supps with daily multivitamin, dark green leafy vegetables.  Exercise, healthy diet, encouraged leisure activities.  Pap, normal 2012, new screening guidelines reviewed. Contraception reviewed and declined will continue condoms.  Harrington Challenger WHNP, 11:10 AM 10/27/2012

## 2012-10-27 NOTE — Patient Instructions (Signed)

## 2012-11-25 ENCOUNTER — Other Ambulatory Visit: Payer: Self-pay

## 2013-01-25 ENCOUNTER — Telehealth: Payer: Self-pay | Admitting: *Deleted

## 2013-01-25 MED ORDER — ACYCLOVIR 5 % EX CREA: TOPICAL_CREAM | CUTANEOUS | Status: DC

## 2013-01-25 NOTE — Telephone Encounter (Signed)
Pt informed, rx sent 

## 2013-01-25 NOTE — Telephone Encounter (Signed)
Pt said she has never been diagnosed with HSV, she has a blister and was told by a friend that valtrex could help with blister. Pt has never took valtrex either. Please advise

## 2013-01-25 NOTE — Telephone Encounter (Signed)
Pt called c/o cold sore on mouth requesting if valtrex could be sent to pharmacy? Please advise

## 2013-01-25 NOTE — Telephone Encounter (Signed)
Call in zovirax cm. 5%  Apply to affected area 5 times daily. Tell her hope that helps, once blister is there this may help more.

## 2013-01-25 NOTE — Telephone Encounter (Signed)
I did not see in her chart that she has been diagnosed with HSV. Has she had cold sores in the past and used Valtrex? If so Valtrex 500 twice daily for 3-5 days #30 prescription is okay to call in.

## 2013-08-02 ENCOUNTER — Other Ambulatory Visit: Payer: Self-pay | Admitting: Women's Health

## 2013-08-02 NOTE — Telephone Encounter (Signed)
Rx called in 

## 2013-08-02 NOTE — Telephone Encounter (Signed)
Pt will be taking a flight and Rx helps with this.

## 2013-08-02 NOTE — Telephone Encounter (Signed)
Ok to refill 

## 2013-10-13 IMAGING — CR DG CHEST 2V
2 series · 2 of 2 positions shown · non-contrast
Comparison: 01/18/2011

CLINICAL DATA: Chest pain

CHEST - 2 VIEW

[w chest pa]
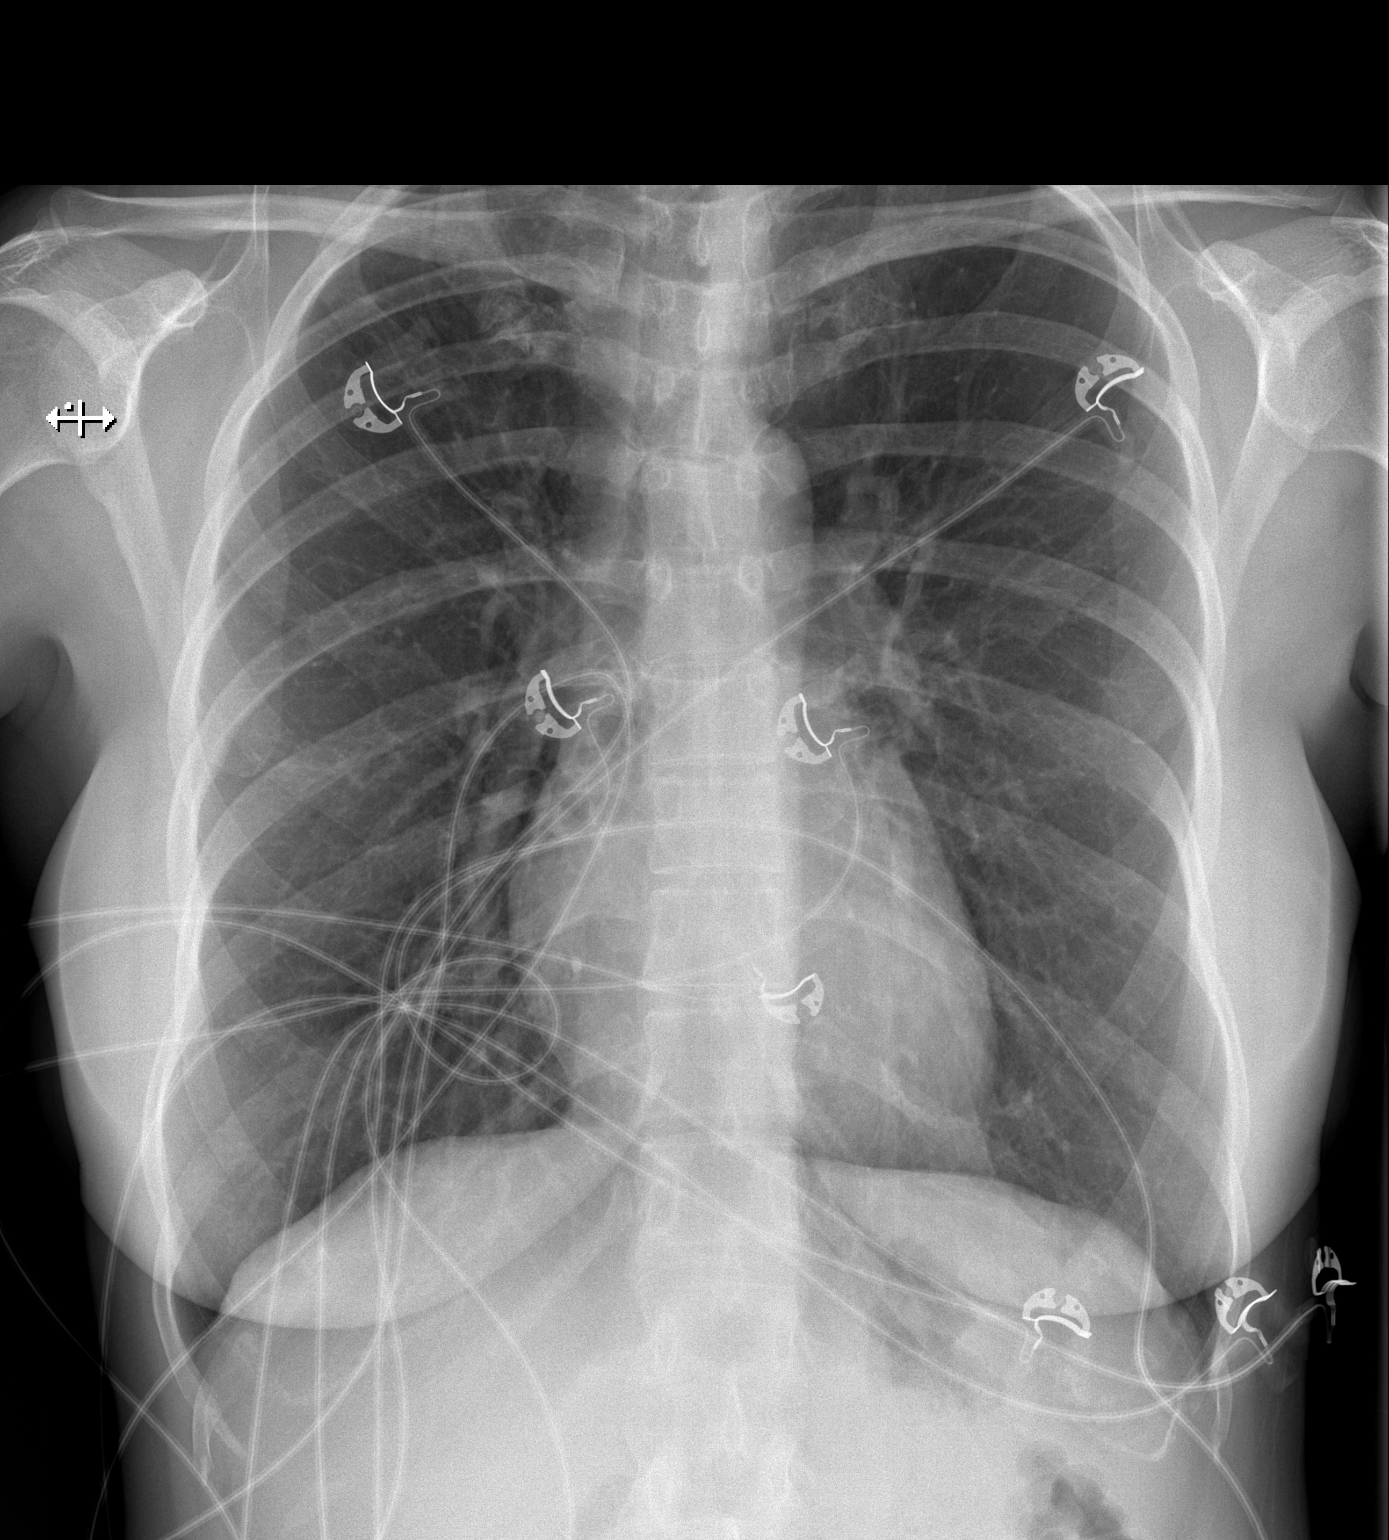

[w chest lat]
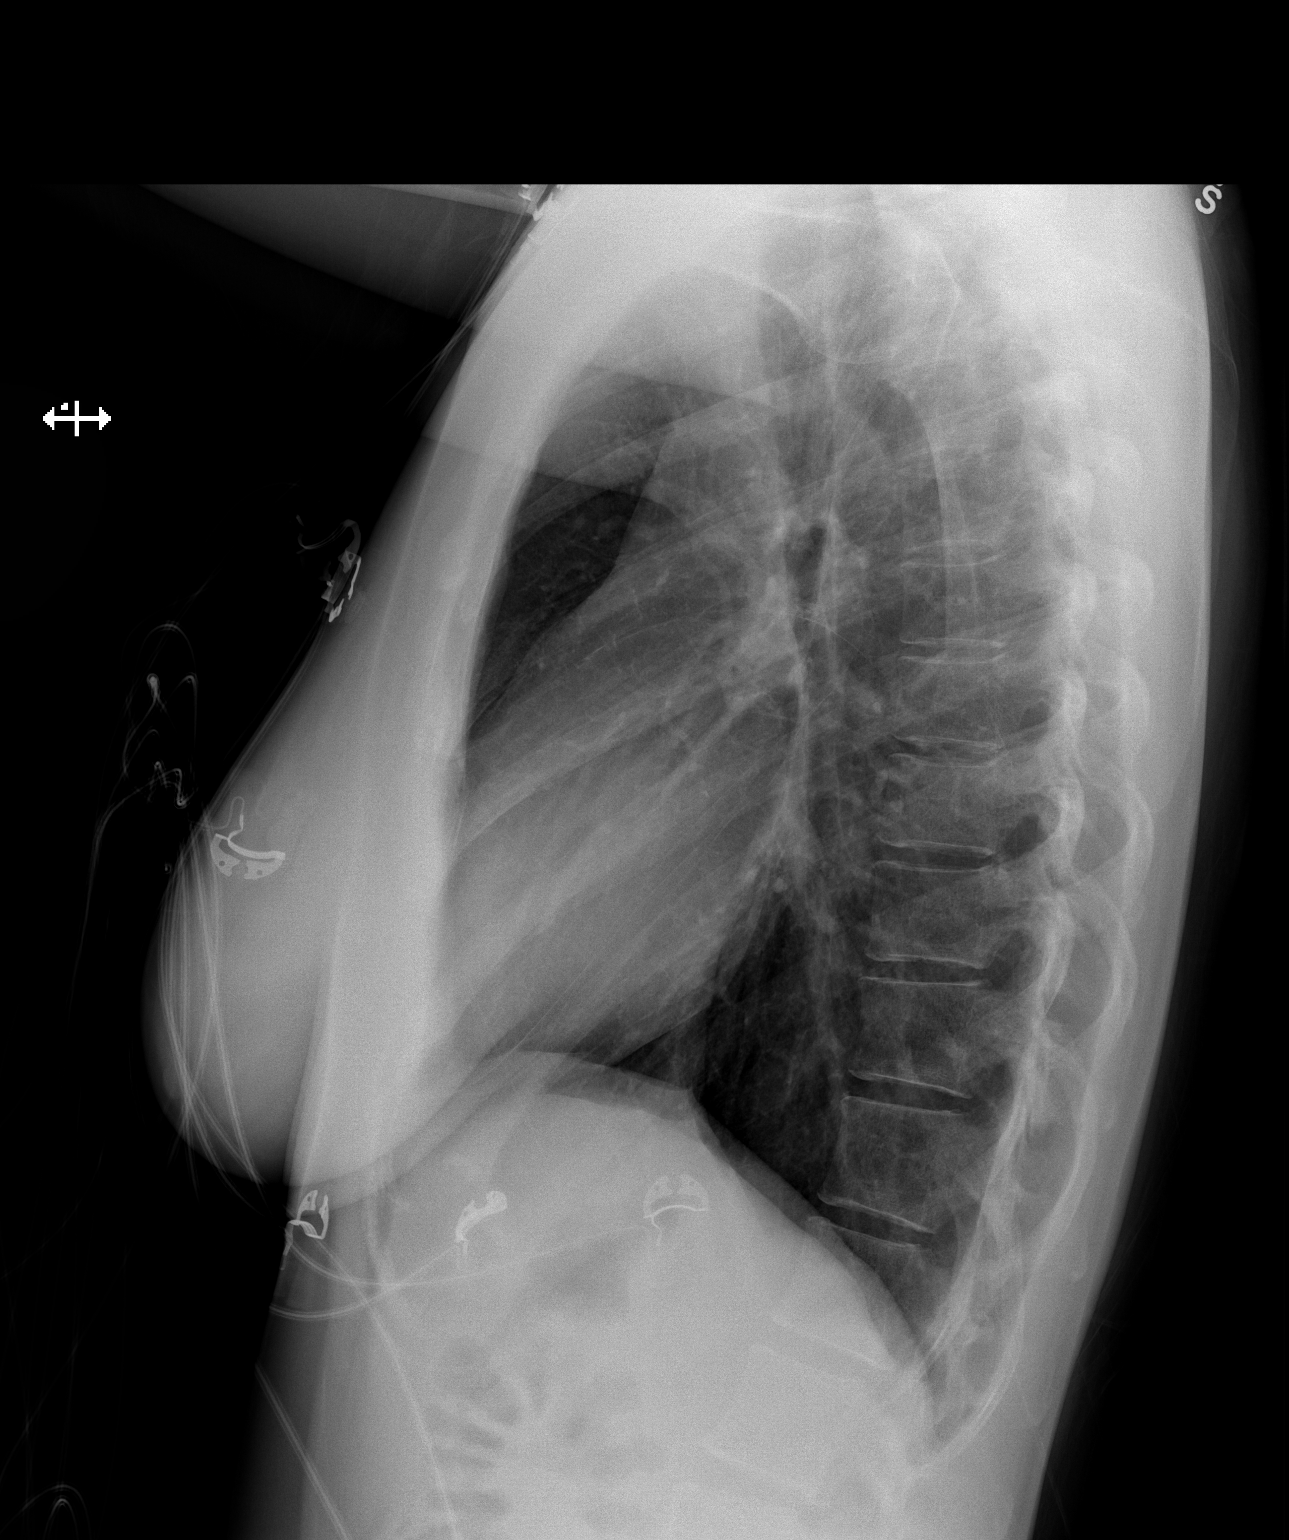

[2 of 2 positions shown; findings below may reference images not displayed]

FINDINGS: The cardiomediastinal silhouette is unremarkable.
There is no evidence of focal airspace disease, pulmonary edema,
suspicious pulmonary nodule/mass, pleural effusion, or
pneumothorax.
No acute bony abnormalities are identified.
IMPRESSION: No evidence of active cardiopulmonary disease.

## 2013-11-21 ENCOUNTER — Encounter: Payer: Self-pay | Admitting: Women's Health

## 2013-12-21 ENCOUNTER — Encounter: Payer: Self-pay | Admitting: Women's Health

## 2013-12-21 ENCOUNTER — Ambulatory Visit (INDEPENDENT_AMBULATORY_CARE_PROVIDER_SITE_OTHER): Payer: 59 | Admitting: Women's Health

## 2013-12-21 VITALS — BP 120/82 | Ht 65.0 in | Wt 138.0 lb

## 2013-12-21 DIAGNOSIS — Z13220 Encounter for screening for lipoid disorders: Secondary | ICD-10-CM

## 2013-12-21 DIAGNOSIS — Z833 Family history of diabetes mellitus: Secondary | ICD-10-CM

## 2013-12-21 DIAGNOSIS — Z113 Encounter for screening for infections with a predominantly sexual mode of transmission: Secondary | ICD-10-CM

## 2013-12-21 DIAGNOSIS — Z01419 Encounter for gynecological examination (general) (routine) without abnormal findings: Secondary | ICD-10-CM

## 2013-12-21 LAB — LIPID PANEL
Cholesterol: 168 mg/dL (ref 0–200)
HDL: 57 mg/dL (ref >39)
LDL CALC: 102 mg/dL — AB (ref 0–99)
TRIGLYCERIDES: 46 mg/dL (ref <150)
Total CHOL/HDL Ratio: 2.9 Ratio
VLDL: 9 mg/dL (ref 0–40)

## 2013-12-21 LAB — CBC WITH DIFFERENTIAL/PLATELET
BASOS ABS: 0 10*3/uL (ref 0.0–0.1)
Basophils Relative: 1 % (ref 0–1)
EOS PCT: 4 % (ref 0–5)
Eosinophils Absolute: 0.2 10*3/uL (ref 0.0–0.7)
HCT: 41.4 % (ref 36.0–46.0)
Hemoglobin: 14.1 g/dL (ref 12.0–15.0)
LYMPHS PCT: 40 % (ref 12–46)
Lymphs Abs: 1.5 10*3/uL (ref 0.7–4.0)
MCH: 30.4 pg (ref 26.0–34.0)
MCHC: 34.1 g/dL (ref 30.0–36.0)
MCV: 89.2 fL (ref 78.0–100.0)
MPV: 10.9 fL (ref 9.4–12.4)
Monocytes Absolute: 0.3 10*3/uL (ref 0.1–1.0)
Monocytes Relative: 8 % (ref 3–12)
NEUTROS PCT: 47 % (ref 43–77)
Neutro Abs: 1.8 10*3/uL (ref 1.7–7.7)
PLATELETS: 246 10*3/uL (ref 150–400)
RBC: 4.64 MIL/uL (ref 3.87–5.11)
RDW: 13.7 % (ref 11.5–15.5)
WBC: 3.8 10*3/uL — AB (ref 4.0–10.5)

## 2013-12-21 LAB — RPR

## 2013-12-21 LAB — GLUCOSE, RANDOM: Glucose, Bld: 81 mg/dL (ref 70–99)

## 2013-12-21 NOTE — Patient Instructions (Signed)

## 2013-12-21 NOTE — Progress Notes (Signed)
Mary Hamilton 1975/01/26 [id number]    History:    Presents for annual exam.  Regular monthly cycle/condoms. Normal Pap  history. Had a Mirena IUD in 2013 out 2014 did not like, increased headaches with birth control pills. Occasional Valium for anxiety with planes.  Past medical history, past surgical history, family history and social history were all reviewed and documented in the EPIC chart. 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 Daughter Mary Hamilton 16 doing well has had gardasil. Mother diabetes, father sister brother migraines.  ROS:  A  12 point ROS was performed and pertinent positives and negatives are included.  Exam:  Filed Vitals:   12/21/13 0942  BP: 120/82    General appearance:  Normal Thyroid:  Symmetrical, normal in size, without palpable masses or nodularity. Respiratory  Auscultation:  Clear without wheezing or rhonchi Cardiovascular  Auscultation:  Regular rate, without rubs, murmurs or gallops  Edema/varicosities:  Not grossly evident Abdominal  Soft,nontender, without masses, guarding or rebound.  Liver/spleen:  No organomegaly noted  Hernia:  None appreciated  Skin  Inspection:  Grossly normal   Breasts: Examined lying and sitting.     Right: Without masses, retractions, discharge or axillary adenopathy.     Left: Without masses, retractions, discharge or axillary adenopathy. Gentitourinary   Inguinal/mons:  Normal without inguinal adenopathy  External genitalia:  Normal  BUS/Urethra/Skene's glands:  Normal  Vagina:  Normal  Cervix:  Normal  Uterus:   normal in size, shape and contour.  Midline and mobile  Adnexa/parametria:     Rt: Without masses or tenderness.   Lt: Without masses or tenderness.  Anus and perineum: Normal  Digital rectal exam: Normal sphincter tone without palpated masses or tenderness  Assessment/Plan:  38 y.o. SHF G1P1 for annual exam.     Contraception management STD screen  Plan: SBE's, continue regular  exercise, calcium rich diet, MVI daily encouraged. Contraception options reviewed and declined will continue condoms. CBC, glucose, lipid panel, UA, Pap normal 2014, new screening guidelines reviewed. GC/Chlamydia, HIV, hep B, C, RPR.    Harrington ChallengerYOUNG,NANCY J WHNP, 1:21 PM 12/21/2013

## 2013-12-22 ENCOUNTER — Encounter: Payer: Self-pay | Admitting: Women's Health

## 2013-12-22 LAB — URINALYSIS W MICROSCOPIC + REFLEX CULTURE
Casts: NONE SEEN
Crystals: NONE SEEN
GLUCOSE, UA: NEGATIVE mg/dL
HGB URINE DIPSTICK: NEGATIVE
KETONES UR: NEGATIVE mg/dL
Leukocytes, UA: NEGATIVE
Nitrite: NEGATIVE
PROTEIN: NEGATIVE mg/dL
Specific Gravity, Urine: 1.027 (ref 1.005–1.030)
Urobilinogen, UA: 0.2 mg/dL (ref 0.0–1.0)
pH: 6.5 (ref 5.0–8.0)

## 2013-12-22 LAB — GC/CHLAMYDIA PROBE AMP
CT PROBE, AMP APTIMA: NEGATIVE
GC Probe RNA: NEGATIVE

## 2013-12-22 LAB — HEPATITIS C ANTIBODY: HCV AB: NEGATIVE

## 2013-12-22 LAB — HEPATITIS B SURFACE ANTIGEN: HEP B S AG: NEGATIVE

## 2013-12-22 LAB — HIV ANTIBODY (ROUTINE TESTING W REFLEX): HIV 1&2 Ab, 4th Generation: NONREACTIVE

## 2013-12-23 LAB — URINE CULTURE
COLONY COUNT: NO GROWTH
Organism ID, Bacteria: NO GROWTH

## 2014-08-13 ENCOUNTER — Emergency Department (HOSPITAL_COMMUNITY)
Admission: EM | Admit: 2014-08-13 | Discharge: 2014-08-14 | Disposition: A | Discharge status: Home / Self Care | Payer: Managed Care, Other (non HMO) | Attending: Emergency Medicine | Admitting: Emergency Medicine

## 2014-08-13 ENCOUNTER — Encounter (HOSPITAL_COMMUNITY): Payer: Self-pay | Admitting: Oncology

## 2014-08-13 DIAGNOSIS — M545 Low back pain: Secondary | ICD-10-CM | POA: Insufficient documentation

## 2014-08-13 DIAGNOSIS — M79602 Pain in left arm: Secondary | ICD-10-CM | POA: Diagnosis present

## 2014-08-13 DIAGNOSIS — Z8679 Personal history of other diseases of the circulatory system: Secondary | ICD-10-CM | POA: Insufficient documentation

## 2014-08-13 DIAGNOSIS — R110 Nausea: Secondary | ICD-10-CM | POA: Diagnosis not present

## 2014-08-13 DIAGNOSIS — R0602 Shortness of breath: Secondary | ICD-10-CM | POA: Insufficient documentation

## 2014-08-13 LAB — I-STAT TROPONIN, ED: TROPONIN I, POC: 0 ng/mL (ref 0.00–0.08)

## 2014-08-13 NOTE — ED Notes (Signed)
Pt presents d/t left arm pain, nausea, weakness and back pain.  Denies injury to arm.

## 2014-08-14 LAB — CBC WITH DIFFERENTIAL/PLATELET
BASOS PCT: 0 % (ref 0–1)
Basophils Absolute: 0 10*3/uL (ref 0.0–0.1)
EOS ABS: 0.2 10*3/uL (ref 0.0–0.7)
EOS PCT: 3 % (ref 0–5)
HCT: 37.5 % (ref 36.0–46.0)
HEMOGLOBIN: 12.5 g/dL (ref 12.0–15.0)
Lymphocytes Relative: 31 % (ref 12–46)
Lymphs Abs: 2.4 10*3/uL (ref 0.7–4.0)
MCH: 30.2 pg (ref 26.0–34.0)
MCHC: 33.3 g/dL (ref 30.0–36.0)
MCV: 90.6 fL (ref 78.0–100.0)
MONO ABS: 0.7 10*3/uL (ref 0.1–1.0)
MONOS PCT: 9 % (ref 3–12)
NEUTROS ABS: 4.6 10*3/uL (ref 1.7–7.7)
Neutrophils Relative %: 57 % (ref 43–77)
PLATELETS: 213 10*3/uL (ref 150–400)
RBC: 4.14 MIL/uL (ref 3.87–5.11)
RDW: 13.5 % (ref 11.5–15.5)
WBC: 8 10*3/uL (ref 4.0–10.5)

## 2014-08-14 LAB — COMPREHENSIVE METABOLIC PANEL
ALBUMIN: 4 g/dL (ref 3.5–5.0)
ALK PHOS: 34 U/L — AB (ref 38–126)
ALT: 10 U/L — ABNORMAL LOW (ref 14–54)
ANION GAP: 3 — AB (ref 5–15)
AST: 17 U/L (ref 15–41)
BILIRUBIN TOTAL: 0.4 mg/dL (ref 0.3–1.2)
BUN: 14 mg/dL (ref 6–20)
CALCIUM: 8.4 mg/dL — AB (ref 8.9–10.3)
CHLORIDE: 107 mmol/L (ref 101–111)
CO2: 27 mmol/L (ref 22–32)
CREATININE: 0.66 mg/dL (ref 0.44–1.00)
GFR calc Af Amer: >60 mL/min (ref >60)
GFR calc non Af Amer: >60 mL/min (ref >60)
GLUCOSE: 108 mg/dL — AB (ref 65–99)
Potassium: 3.7 mmol/L (ref 3.5–5.1)
Sodium: 137 mmol/L (ref 135–145)
Total Protein: 6.9 g/dL (ref 6.5–8.1)

## 2014-08-14 LAB — LIPASE, BLOOD: LIPASE: 43 U/L (ref 22–51)

## 2014-08-14 LAB — I-STAT TROPONIN, ED: Troponin i, poc: 0 ng/mL (ref 0.00–0.08)

## 2014-08-14 MED ORDER — ONDANSETRON HCL 4 MG PO TABS: 4.0000 mg | ORAL_TABLET | Freq: Four times a day (QID) | ORAL | Status: DC

## 2014-08-14 MED ORDER — ONDANSETRON 8 MG PO TBDP
8.0000 mg | ORAL_TABLET | Freq: Once | ORAL | Status: AC
  Administered 2014-08-14: 8 mg via ORAL
  Filled 2014-08-14: qty 1

## 2014-08-14 NOTE — ED Provider Notes (Signed)
CSN: [id number]     Arrival date & time 08/13/14  2157 History   First MD Initiated Contact with Patient 08/13/14 2227     Chief Complaint  Patient presents with  . Arm Pain     (Consider location/radiation/quality/duration/timing/severity/associated sxs/prior Treatment) Patient is a 39 y.o. female presenting with arm pain. The history is provided by the patient. No language interpreter was used.  Arm Pain This is a new problem. Associated symptoms include nausea. Pertinent negatives include no chest pain, chills, coughing or fever. Associated symptoms comments: The patient reports nausea earlier, generalized weakness, mild SOB and aching of the left arm. Currently, the pain is improved but nausea persists. No continuous SOB, cough or fever. She denies other heath conditions, is on no medication daily. No family history of heart disease. Marland Kitchen    Past Medical History  Diagnosis Date  . Migraine headache    History reviewed. No pertinent past surgical history. Family History  Problem Relation Age of Onset  . Diabetes Mother   . Migraines Father   . Migraines Sister   . Migraines Brother   . Diabetes Maternal Aunt   . Diabetes Maternal Uncle   . Diabetes Maternal Grandmother   . Diabetes Maternal Grandfather    History  Substance Use Topics  . Smoking status: Never Smoker   . Smokeless tobacco: Never Used  . Alcohol Use: No   OB History    Gravida Para Term Preterm AB TAB SAB Ectopic Multiple Living   Review of Systems  Constitutional: Negative for fever and chills.  Respiratory: Positive for shortness of breath. Negative for cough.   Cardiovascular: Negative.  Negative for chest pain and leg swelling.  Gastrointestinal: Positive for nausea.  Genitourinary: Negative.   Musculoskeletal: Positive for back pain.  Skin: Negative.   Neurological: Negative.       Allergies  Review of patient's allergies indicates no known allergies.  Home Medications    Prior to Admission medications   Medication Sig Start Date End Date Taking? Authorizing Provider  ALPRAZolam Prudy Feeler) 0.25 MG tablet TAKE 1 TABLET AT BEDTIME AS NEEDED FOR SLEEP Patient not taking: Reported on 08/13/2014 08/02/13   Harrington Challenger, NP  diazepam (VALIUM) 5 MG tablet Take 1 tablet (5 mg total) by mouth every 6 (six) hours as needed for anxiety. Patient not taking: Reported on 08/13/2014 10/27/12   Harrington Challenger, NP  Ferrous Gluconate 324 (37.5 FE) MG TABS Take 1 tablet by mouth every morning. 10/20/13   Historical Provider, MD   BP 96/57 mmHg  Pulse 66  Temp(Src) 98.2 F (36.8 C) (Oral)  Resp 21  Ht  (1.651 m)  Wt 138 lb (62.596 kg)  BMI 22.96 kg/m2  SpO2 99%  LMP 07/15/2014 (Exact Date) Physical Exam  Constitutional: She is oriented to person, place, and time. She appears well-developed and well-nourished.  HENT:  Head: Normocephalic.  Neck: Normal range of motion. Neck supple.  Cardiovascular: Normal rate and regular rhythm.   Pulmonary/Chest: Effort normal and breath sounds normal.  Abdominal: Soft. Bowel sounds are normal. There is no tenderness. There is no rebound and no guarding.  Musculoskeletal: Normal range of motion. She exhibits no edema.  No reproducible tenderness of left upper arm.   Neurological: She is alert and oriented to person, place, and time. Coordination normal.  Skin: Skin is warm and dry. No rash noted.  Psychiatric: She has  a normal mood and affect.    ED Course  Procedures (including critical care time) Labs Review Labs Reviewed  CBC WITH DIFFERENTIAL/PLATELET  COMPREHENSIVE METABOLIC PANEL  LIPASE, BLOOD  I-STAT TROPOININ, ED  I-STAT TROPOININ, ED   Results for orders placed or performed during the hospital encounter of 08/13/14  CBC with Differential  Result Value Ref Range   WBC 8.0 4.0 - 10.5 K/uL   RBC 4.14 3.87 - 5.11 MIL/uL   Hemoglobin 12.5 12.0 - 15.0 g/dL   HCT 16.1 09.6 - 04.5 %   MCV 90.6 78.0 - 100.0 fL   MCH  30.2 26.0 - 34.0 pg   MCHC 33.3 30.0 - 36.0 g/dL   RDW 40.9 81.1 - 91.4 %   Platelets 213 150 - 400 K/uL   Neutrophils Relative % 57 43 - 77 %   Neutro Abs 4.6 1.7 - 7.7 K/uL   Lymphocytes Relative 31 12 - 46 %   Lymphs Abs 2.4 0.7 - 4.0 K/uL   Monocytes Relative 9 3 - 12 %   Monocytes Absolute 0.7 0.1 - 1.0 K/uL   Eosinophils Relative 3 0 - 5 %   Eosinophils Absolute 0.2 0.0 - 0.7 K/uL   Basophils Relative 0 0 - 1 %   Basophils Absolute 0.0 0.0 - 0.1 K/uL  Comprehensive metabolic panel  Result Value Ref Range   Sodium 137 135 - 145 mmol/L   Potassium 3.7 3.5 - 5.1 mmol/L   Chloride 107 101 - 111 mmol/L   CO2 27 22 - 32 mmol/L   Glucose, Bld 108 (H) 65 - 99 mg/dL   BUN 14 6 - 20 mg/dL   Creatinine, Ser 7.82 0.44 - 1.00 mg/dL   Calcium 8.4 (L) 8.9 - 10.3 mg/dL   Total Protein 6.9 6.5 - 8.1 g/dL   Albumin 4.0 3.5 - 5.0 g/dL   AST 17 15 - 41 U/L   ALT 10 (L) 14 - 54 U/L   Alkaline Phosphatase 34 (L) 38 - 126 U/L   Total Bilirubin 0.4 0.3 - 1.2 mg/dL   GFR calc non Af Amer >60 >60 mL/min   GFR calc Af Amer >60 >60 mL/min   Anion gap 3 (L) 5 - 15  Lipase, blood  Result Value Ref Range   Lipase 43 22 - 51 U/L  I-stat troponin, ED  Result Value Ref Range   Troponin i, poc 0.00 0.00 - 0.08 ng/mL   Comment 3          I-stat troponin, ED  Result Value Ref Range   Troponin i, poc 0.00 0.00 - 0.08 ng/mL   Comment 3           No results found.  Imaging Review No results found.   EKG Interpretation None      MDM   Final diagnoses:  None    1. Nausea  She appears comfortable. She has a Heart Score of 0 - doubt ACS. Nausea is resolved. She is encouraged to follow up with PCP for recheck if symptoms persist.     Elpidio Anis, PA-C 08/14/14 2358  April Palumbo, MD 08/26/14 2304

## 2014-08-14 NOTE — Discharge Instructions (Signed)

## 2014-12-26 ENCOUNTER — Encounter: Payer: 59 | Admitting: Women's Health

## 2014-12-27 ENCOUNTER — Encounter: Payer: Self-pay | Admitting: Women's Health

## 2014-12-27 ENCOUNTER — Other Ambulatory Visit (HOSPITAL_COMMUNITY)
Admission: RE | Admit: 2014-12-27 | Discharge: 2014-12-27 | Disposition: A | Discharge status: Home / Self Care | Payer: Managed Care, Other (non HMO) | Source: Ambulatory Visit | Attending: Gynecology | Admitting: Gynecology

## 2014-12-27 ENCOUNTER — Ambulatory Visit (INDEPENDENT_AMBULATORY_CARE_PROVIDER_SITE_OTHER): Payer: Managed Care, Other (non HMO) | Admitting: Women's Health

## 2014-12-27 VITALS — BP 122/80 | Ht 65.0 in | Wt 138.0 lb

## 2014-12-27 DIAGNOSIS — G43009 Migraine without aura, not intractable, without status migrainosus: Secondary | ICD-10-CM

## 2014-12-27 DIAGNOSIS — Z01419 Encounter for gynecological examination (general) (routine) without abnormal findings: Secondary | ICD-10-CM | POA: Insufficient documentation

## 2014-12-27 DIAGNOSIS — Z113 Encounter for screening for infections with a predominantly sexual mode of transmission: Secondary | ICD-10-CM

## 2014-12-27 LAB — COMPREHENSIVE METABOLIC PANEL
ALBUMIN: 4.8 g/dL (ref 3.6–5.1)
ALT: 10 U/L (ref 6–29)
AST: 18 U/L (ref 10–30)
Alkaline Phosphatase: 38 U/L (ref 33–115)
BUN: 9 mg/dL (ref 7–25)
CHLORIDE: 104 mmol/L (ref 98–110)
CO2: 24 mmol/L (ref 20–31)
Calcium: 9.6 mg/dL (ref 8.6–10.2)
Creat: 0.78 mg/dL (ref 0.50–1.10)
Glucose, Bld: 86 mg/dL (ref 65–99)
POTASSIUM: 4.4 mmol/L (ref 3.5–5.3)
Sodium: 137 mmol/L (ref 135–146)
TOTAL PROTEIN: 7.5 g/dL (ref 6.1–8.1)
Total Bilirubin: 0.6 mg/dL (ref 0.2–1.2)

## 2014-12-27 MED ORDER — RIZATRIPTAN BENZOATE 5 MG PO TABS: 5.0000 mg | ORAL_TABLET | ORAL | Status: DC | PRN

## 2014-12-27 MED ORDER — ELETRIPTAN HYDROBROMIDE 20 MG PO TABS: 20.0000 mg | ORAL_TABLET | ORAL | Status: DC | PRN

## 2014-12-27 NOTE — Patient Instructions (Signed)
Health Maintenance, Female Adopting a healthy lifestyle and getting preventive care can go a long way to promote health and wellness. Talk with your health care provider about what schedule of regular examinations is right for you. This is a good chance for you to check in with your provider about disease prevention and staying healthy. In between checkups, there are plenty of things you can do on your own. Experts have done a lot of research about which lifestyle changes and preventive measures are most likely to keep you healthy. Ask your health care provider for more information. WEIGHT AND DIET  Eat a healthy diet  Be sure to include plenty of vegetables, fruits, low-fat dairy products, and lean protein.  Do not eat a lot of foods high in solid fats, added sugars, or salt.  Get regular exercise. This is one of the most important things you can do for your health.  Most adults should exercise for at least 150 minutes each week. The exercise should increase your heart rate and make you sweat (moderate-intensity exercise).  Most adults should also do strengthening exercises at least twice a week. This is in addition to the moderate-intensity exercise.  Maintain a healthy weight  Body mass index (BMI) is a measurement that can be used to identify possible weight problems. It estimates body fat based on height and weight. Your health care provider can help determine your BMI and help you achieve or maintain a healthy weight.  For females 20 years of age and older:   A BMI below 18.5 is considered underweight.  A BMI of 18.5 to 24.9 is normal.  A BMI of 25 to 29.9 is considered overweight.  A BMI of 30 and above is considered obese.  Watch levels of cholesterol and blood lipids  You should start having your blood tested for lipids and cholesterol at 37 years of age, then have this test every 5 years.  You may need to have your cholesterol levels checked more often if:  Your lipid  or cholesterol levels are high.  You are older than 37 years of age.  You are at high risk for heart disease.  CANCER SCREENING   Lung Cancer  Lung cancer screening is recommended for adults 55-80 years old who are at high risk for lung cancer because of a history of smoking.  A yearly low-dose CT scan of the lungs is recommended for people who:  Currently smoke.  Have quit within the past 15 years.  Have at least a 30-pack-year history of smoking. A pack year is smoking an average of one pack of cigarettes a day for 1 year.  Yearly screening should continue until it has been 15 years since you quit.  Yearly screening should stop if you develop a health problem that would prevent you from having lung cancer treatment.  Breast Cancer  Practice breast self-awareness. This means understanding how your breasts normally appear and feel.  It also means doing regular breast self-exams. Let your health care provider know about any changes, no matter how small.  If you are in your 20s or 30s, you should have a clinical breast exam (CBE) by a health care provider every 1-3 years as part of a regular health exam.  If you are 40 or older, have a CBE every year. Also consider having a breast X-ray (mammogram) every year.  If you have a family history of breast cancer, talk to your health care provider about genetic screening.  If you   are at high risk for breast cancer, talk to your health care provider about having an MRI and a mammogram every year.  Breast cancer gene (BRCA) assessment is recommended for women who have family members with BRCA-related cancers. BRCA-related cancers include:  Breast.  Ovarian.  Tubal.  Peritoneal cancers.  Results of the assessment will determine the need for genetic counseling and BRCA1 and BRCA2 testing. Cervical Cancer Your health care provider may recommend that you be screened regularly for cancer of the pelvic organs (ovaries, uterus, and  vagina). This screening involves a pelvic examination, including checking for microscopic changes to the surface of your cervix (Pap test). You may be encouraged to have this screening done every 3 years, beginning at age 21.  For women ages 30-65, health care providers may recommend pelvic exams and Pap testing every 3 years, or they may recommend the Pap and pelvic exam, combined with testing for human papilloma virus (HPV), every 5 years. Some types of HPV increase your risk of cervical cancer. Testing for HPV may also be done on women of any age with unclear Pap test results.  Other health care providers may not recommend any screening for nonpregnant women who are considered low risk for pelvic cancer and who do not have symptoms. Ask your health care provider if a screening pelvic exam is right for you.  If you have had past treatment for cervical cancer or a condition that could lead to cancer, you need Pap tests and screening for cancer for at least 20 years after your treatment. If Pap tests have been discontinued, your risk factors (such as having a new sexual partner) need to be reassessed to determine if screening should resume. Some women have medical problems that increase the chance of getting cervical cancer. In these cases, your health care provider may recommend more frequent screening and Pap tests. Colorectal Cancer  This type of cancer can be detected and often prevented.  Routine colorectal cancer screening usually begins at 37 years of age and continues through 37 years of age.  Your health care provider may recommend screening at an earlier age if you have risk factors for colon cancer.  Your health care provider may also recommend using home test kits to check for hidden blood in the stool.  A small camera at the end of a tube can be used to examine your colon directly (sigmoidoscopy or colonoscopy). This is done to check for the earliest forms of colorectal  cancer.  Routine screening usually begins at age 50.  Direct examination of the colon should be repeated every 5-10 years through 37 years of age. However, you may need to be screened more often if early forms of precancerous polyps or small growths are found. Skin Cancer  Check your skin from head to toe regularly.  Tell your health care provider about any new moles or changes in moles, especially if there is a change in a mole's shape or color.  Also tell your health care provider if you have a mole that is larger than the size of a pencil eraser.  Always use sunscreen. Apply sunscreen liberally and repeatedly throughout the day.  Protect yourself by wearing long sleeves, pants, a wide-brimmed hat, and sunglasses whenever you are outside. HEART DISEASE, DIABETES, AND HIGH BLOOD PRESSURE   High blood pressure causes heart disease and increases the risk of stroke. High blood pressure is more likely to develop in:  People who have blood pressure in the high end   of the normal range (130-139/85-89 mm Hg).  People who are overweight or obese.  People who are African American.  If you are 38-23 years of age, have your blood pressure checked every 3-5 years. If you are 61 years of age or older, have your blood pressure checked every year. You should have your blood pressure measured twice--once when you are at a hospital or clinic, and once when you are not at a hospital or clinic. Record the average of the two measurements. To check your blood pressure when you are not at a hospital or clinic, you can use:  An automated blood pressure machine at a pharmacy.  A home blood pressure monitor.  If you are between 45 years and 39 years old, ask your health care provider if you should take aspirin to prevent strokes.  Have regular diabetes screenings. This involves taking a blood sample to check your fasting blood sugar level.  If you are at a normal weight and have a low risk for diabetes,  have this test once every three years after 37 years of age.  If you are overweight and have a high risk for diabetes, consider being tested at a younger age or more often. PREVENTING INFECTION  Hepatitis B  If you have a higher risk for hepatitis B, you should be screened for this virus. You are considered at high risk for hepatitis B if:  You were born in a country where hepatitis B is common. Ask your health care provider which countries are considered high risk.  Your parents were born in a high-risk country, and you have not been immunized against hepatitis B (hepatitis B vaccine).  You have HIV or AIDS.  You use needles to inject street drugs.  You live with someone who has hepatitis B.  You have had sex with someone who has hepatitis B.  You get hemodialysis treatment.  You take certain medicines for conditions, including cancer, organ transplantation, and autoimmune conditions. Hepatitis C  Blood testing is recommended for:  Everyone born from [birth year] through 1963.  Anyone with known risk factors for hepatitis C. Sexually transmitted infections (STIs)  You should be screened for sexually transmitted infections (STIs) including gonorrhea and chlamydia if:  You are sexually active and are younger than 37 years of age.  You are older than 37 years of age and your health care provider tells you that you are at risk for this type of infection.  Your sexual activity has changed since you were last screened and you are at an increased risk for chlamydia or gonorrhea. Ask your health care provider if you are at risk.  If you do not have HIV, but are at risk, it may be recommended that you take a prescription medicine daily to prevent HIV infection. This is called pre-exposure prophylaxis (PrEP). You are considered at risk if:  You are sexually active and do not regularly use condoms or know the HIV status of your partner(s).  You take drugs by injection.  You are sexually  active with a partner who has HIV. Talk with your health care provider about whether you are at high risk of being infected with HIV. If you choose to begin PrEP, you should first be tested for HIV. You should then be tested every 3 months for as long as you are taking PrEP.  PREGNANCY   If you are premenopausal and you may become pregnant, ask your health care provider about preconception counseling.  If you may  become pregnant, take 400 to 800 micrograms (mcg) of folic acid every day.  If you want to prevent pregnancy, talk to your health care provider about birth control (contraception). OSTEOPOROSIS AND MENOPAUSE   Osteoporosis is a disease in which the bones lose minerals and strength with aging. This can result in serious bone fractures. Your risk for osteoporosis can be identified using a bone density scan.  If you are 61 years of age or older, or if you are at risk for osteoporosis and fractures, ask your health care provider if you should be screened.  Ask your health care provider whether you should take a calcium or vitamin D supplement to lower your risk for osteoporosis.  Menopause may have certain physical symptoms and risks.  Hormone replacement therapy may reduce some of these symptoms and risks. Talk to your health care provider about whether hormone replacement therapy is right for you.  HOME CARE INSTRUCTIONS   Schedule regular health, dental, and eye exams.  Stay current with your immunizations.   Do not use any tobacco products including cigarettes, chewing tobacco, or electronic cigarettes.  If you are pregnant, do not drink alcohol.  If you are breastfeeding, limit how much and how often you drink alcohol.  Limit alcohol intake to no more than 1 drink per day for nonpregnant women. One drink equals 12 ounces of beer, 5 ounces of wine, or 1 ounces of hard liquor.  Do not use street drugs.  Do not share needles.  Ask your health care provider for help if  you need support or information about quitting drugs.  Tell your health care provider if you often feel depressed.  Tell your health care provider if you have ever been abused or do not feel safe at home.   This information is not intended to replace advice given to you by your health care provider. Make sure you discuss any questions you have with your health care provider.   Document Released: 07/22/2010 Document Revised: 01/27/2014 Document Reviewed: 12/08/2012 Elsevier Interactive Patient Education [employer].

## 2014-12-27 NOTE — Progress Notes (Addendum)
Mary Hamilton 1975-04-18 [id number]    History:    Presents for annual exam.  Regular monthly cycle/condoms. New partner in the past year, currently not sexually active. History of migraines without aura rare use of Relpax, Imitrex noneffective. Normal Pap history. Hospitalized 06/2014 for arm numbness, symptoms resolved.  Past medical history, past surgical history, family history and social history were all reviewed and documented in the EPIC chart. 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 Daughter Mary Hamilton 17 doing well. Mother diabetes. Father, sister, brother migraines.  ROS:  A ROS was performed and pertinent positives and negatives are included.  Exam:  Filed Vitals:   12/27/14 0934  BP: 122/80    General appearance:  Normal Thyroid:  Symmetrical, normal in size, without palpable masses or nodularity. Respiratory  Auscultation:  Clear without wheezing or rhonchi Cardiovascular  Auscultation:  Regular rate, without rubs, murmurs or gallops  Edema/varicosities:  Not grossly evident Abdominal  Soft,nontender, without masses, guarding or rebound.  Liver/spleen:  No organomegaly noted  Hernia:  None appreciated  Skin  Inspection:  Grossly normal   Breasts: Examined lying and sitting.     Right: Without masses, retractions, discharge or axillary adenopathy.     Left: Without masses, retractions, discharge or axillary adenopathy. Gentitourinary   Inguinal/mons:  Normal without inguinal adenopathy  External genitalia:  Normal  BUS/Urethra/Skene's glands:  Normal  Vagina:  Normal  Cervix:  Normal  Uterus:   normal in size, shape and contour.  Midline and mobile  Adnexa/parametria:     Rt: Without masses or tenderness.   Lt: Without masses or tenderness.  Anus and perineum: Normal  Digital rectal exam: Normal sphincter tone without palpated masses or tenderness  Assessment/Plan:  39 y.o. SHF G1 P1 for annual exam with no complaints.  Monthly cycle/condoms STD screen Rare  migraines  Plan: Contraception options reviewed and declined will continue condoms, Plan B emergency contraception reviewed. Relpax 20 mg mg by mouth every 2 hours not to exceed 2 doses in 24, prescription, proper use given and reviewed. SBE's, exercise, calcium rich diet, MVI daily, vitamin D 1000 daily encouraged. CMP, UA, Pap with HR HPV, GC/Chlamydia, HIV, hep B, C, RPRHarrington Challenger.   Mary Hamilton J WHNP, 9:59 AM 12/27/2014

## 2014-12-28 LAB — RPR

## 2014-12-28 LAB — URINALYSIS W MICROSCOPIC + REFLEX CULTURE
Bacteria, UA: NONE SEEN [HPF]
Bilirubin Urine: NEGATIVE
CASTS: NONE SEEN [LPF]
CRYSTALS: NONE SEEN [HPF]
Glucose, UA: NEGATIVE
HGB URINE DIPSTICK: NEGATIVE
KETONES UR: NEGATIVE
Leukocytes, UA: NEGATIVE
Nitrite: NEGATIVE
PROTEIN: NEGATIVE
RBC / HPF: NONE SEEN RBC/HPF (ref <=2)
SQUAMOUS EPITHELIAL / LPF: NONE SEEN [HPF] (ref <=5)
Specific Gravity, Urine: 1.019 (ref 1.001–1.035)
WBC, UA: NONE SEEN WBC/HPF (ref <=5)
Yeast: NONE SEEN [HPF]
pH: 7.5 (ref 5.0–8.0)

## 2014-12-28 LAB — CYTOLOGY - PAP

## 2014-12-28 LAB — HEPATITIS B SURFACE ANTIGEN: HEP B S AG: NEGATIVE

## 2014-12-28 LAB — HEPATITIS C ANTIBODY: HCV AB: NEGATIVE

## 2014-12-28 LAB — GC/CHLAMYDIA PROBE AMP
CT PROBE, AMP APTIMA: NOT DETECTED
GC PROBE AMP APTIMA: NOT DETECTED

## 2014-12-28 LAB — HIV ANTIBODY (ROUTINE TESTING W REFLEX): HIV 1&2 Ab, 4th Generation: NONREACTIVE

## 2015-04-02 ENCOUNTER — Ambulatory Visit (INDEPENDENT_AMBULATORY_CARE_PROVIDER_SITE_OTHER): Payer: Managed Care, Other (non HMO) | Admitting: Family Medicine

## 2015-04-02 ENCOUNTER — Ambulatory Visit (INDEPENDENT_AMBULATORY_CARE_PROVIDER_SITE_OTHER): Payer: Managed Care, Other (non HMO)

## 2015-04-02 VITALS — BP 90/68 | HR 76 | Temp 98.6°F | Resp 16 | Ht 65.0 in | Wt 137.0 lb

## 2015-04-02 DIAGNOSIS — R519 Headache, unspecified: Secondary | ICD-10-CM

## 2015-04-02 DIAGNOSIS — R1013 Epigastric pain: Secondary | ICD-10-CM | POA: Diagnosis not present

## 2015-04-02 DIAGNOSIS — R002 Palpitations: Secondary | ICD-10-CM

## 2015-04-02 DIAGNOSIS — R51 Headache: Secondary | ICD-10-CM

## 2015-04-02 DIAGNOSIS — H538 Other visual disturbances: Secondary | ICD-10-CM

## 2015-04-02 DIAGNOSIS — R208 Other disturbances of skin sensation: Secondary | ICD-10-CM

## 2015-04-02 DIAGNOSIS — R5383 Other fatigue: Secondary | ICD-10-CM

## 2015-04-02 DIAGNOSIS — M545 Low back pain: Secondary | ICD-10-CM

## 2015-04-02 DIAGNOSIS — R9431 Abnormal electrocardiogram [ECG] [EKG]: Secondary | ICD-10-CM | POA: Diagnosis not present

## 2015-04-02 LAB — POCT CBC
Granulocyte percent: 63.2 %G (ref 37–80)
HCT, POC: 41.7 % (ref 37.7–47.9)
Hemoglobin: 14.5 g/dL (ref 12.2–16.2)
LYMPH, POC: 1.6 (ref 0.6–3.4)
MCH, POC: 31 pg (ref 27–31.2)
MCHC: 34.7 g/dL (ref 31.8–35.4)
MCV: 89.4 fL (ref 80–97)
MID (CBC): 0.6 (ref 0–0.9)
MPV: 8.4 fL (ref 0–99.8)
PLATELET COUNT, POC: 209 10*3/uL (ref 142–424)
POC Granulocyte: 3.8 (ref 2–6.9)
POC LYMPH %: 26.8 % (ref 10–50)
POC MID %: 10 %M (ref 0–12)
RBC: 4.67 M/uL (ref 4.04–5.48)
RDW, POC: 13.6 %
WBC: 6 10*3/uL (ref 4.6–10.2)

## 2015-04-02 LAB — BASIC METABOLIC PANEL
BUN: 11 mg/dL (ref 7–25)
CHLORIDE: 104 mmol/L (ref 98–110)
CO2: 29 mmol/L (ref 20–31)
Calcium: 9.5 mg/dL (ref 8.6–10.2)
Creat: 0.67 mg/dL (ref 0.50–1.10)
GLUCOSE: 96 mg/dL (ref 65–99)
POTASSIUM: 4.4 mmol/L (ref 3.5–5.3)
SODIUM: 141 mmol/L (ref 135–146)

## 2015-04-02 LAB — GLUCOSE, POCT (MANUAL RESULT ENTRY): POC GLUCOSE: 109 mg/dL — AB (ref 70–99)

## 2015-04-02 LAB — TSH: TSH: 2.16 mIU/L

## 2015-04-02 NOTE — Patient Instructions (Addendum)
IF you received an x-ray today, you will receive an invoice from [facility]. Please contact [facility] at [phone] with questions or concerns regarding your invoice.   IF you received labwork today, you will receive an invoice from [facility]. Please contact [facility] at [phone] with questions or concerns regarding your invoice.   Our billing staff will not be able to assist you with questions regarding bills from these companies.  You will be contacted with the lab results as soon as they are available. The fastest way to get your results is to activate your My Chart account. Instructions are located on the last page of this paperwork. If you have not heard from us regarding the results in 2 weeks, please contact this office.  Your blood sugar and blood count were normal here in the office. X-ray appears okay as well. We will check some other blood tests and was sent off, but will need to see neurology for further evaluation of your symptoms, and cardiology for the heart palpitations. We will order these referrals.   Return to the clinic or go to the nearest emergency room if any of your symptoms worsen or new symptoms occur.   Palpitations A palpitation is the feeling that your heartbeat is irregular or is faster than normal. It may feel like your heart is fluttering or skipping a beat. Palpitations are usually not a serious problem. However, in some cases, you may need further medical evaluation. CAUSES  Palpitations can be caused by:  Smoking.  Caffeine or other stimulants, such as diet pills or energy drinks.  Alcohol.  Stress and anxiety.  Strenuous physical activity.  Fatigue.  Certain medicines.  Heart disease, especially if you have a history of irregular heart rhythms (arrhythmias), such as atrial fibrillation, atrial flutter, or supraventricular tachycardia.  An improperly working pacemaker or  defibrillator. DIAGNOSIS  To find the cause of your palpitations, your health care provider will take your medical history and perform a physical exam. Your health care provider may also have you take a test called an ambulatory electrocardiogram (ECG). An ECG records your heartbeat patterns over a 24-hour period. You may also have other tests, such as:  Transthoracic echocardiogram (TTE). During echocardiography, sound waves are used to evaluate how blood flows through your heart.  Transesophageal echocardiogram (TEE).  Cardiac monitoring. This allows your health care provider to monitor your heart rate and rhythm in real time.  Holter monitor. This is a portable device that records your heartbeat and can help diagnose heart arrhythmias. It allows your health care provider to track your heart activity for several days, if needed.  Stress tests by exercise or by giving medicine that makes the heart beat faster. TREATMENT  Treatment of palpitations depends on the cause of your symptoms and can vary greatly. Most cases of palpitations do not require any treatment other than time, relaxation, and monitoring your symptoms. Other causes, such as atrial fibrillation, atrial flutter, or supraventricular tachycardia, usually require further treatment. HOME CARE INSTRUCTIONS   Avoid:  Caffeinated coffee, tea, soft drinks, diet pills, and energy drinks.  Chocolate.  Alcohol.  Stop smoking if you smoke.  Reduce your stress and anxiety. Things that can help you relax include:  A method of controlling things in your body, such as your heartbeats, with your mind (biofeedback).  Yoga.  Meditation.  Physical activity such as swimming, jogging, or walking.  Get plenty of rest and sleep. SEEK MEDICAL CARE IF:   You continue  to have a fast or irregular heartbeat beyond 24 hours.  Your palpitations occur more often. SEEK IMMEDIATE MEDICAL CARE IF:  You have chest pain or shortness of  breath.  You have a severe headache.  You feel dizzy or you faint. MAKE SURE YOU:  Understand these instructions.  Will watch your condition.  Will get help right away if you are not doing well or get worse.   This information is not intended to replace advice given to you by your health care provider. Make sure you discuss any questions you have with your health care provider.   Document Released: 01/04/2000 Document Revised: 01/11/2013 Document Reviewed: 03/07/2011 Elsevier Interactive Patient Education 2016 [employer].    Fatigue Fatigue is feeling tired all of the time, a lack of energy, or a lack of motivation. Occasional or mild fatigue is often a normal response to activity or life in general. However, long-lasting (chronic) or extreme fatigue may indicate an underlying medical condition. HOME CARE INSTRUCTIONS  Watch your fatigue for any changes. The following actions may help to lessen any discomfort you are feeling:  Talk to your health care provider about how much sleep you need each night. Try to get the required amount every night.  Take medicines only as directed by your health care provider.  Eat a healthy and nutritious diet. Ask your health care provider if you need help changing your diet.  Drink enough fluid to keep your urine clear or pale yellow.  Practice ways of relaxing, such as yoga, meditation, massage therapy, or acupuncture.  Exercise regularly.   Change situations that cause you stress. Try to keep your work and personal routine reasonable.  Do not abuse illegal drugs.  Limit alcohol intake to no more than 1 drink per day for nonpregnant women and 2 drinks per day for men. One drink equals 12 ounces of beer, 5 ounces of wine, or 1 ounces of hard liquor.  Take a multivitamin, if directed by your health care provider. SEEK MEDICAL CARE IF:   Your fatigue does not get better.  You have a fever.   You have unintentional weight loss or  gain.  You have headaches.   You have difficulty:   Falling asleep.  Sleeping throughout the night.  You feel angry, guilty, anxious, or sad.   You are unable to have a bowel movement (constipation).   You skin is dry.   Your legs or another part of your body is swollen.  SEEK IMMEDIATE MEDICAL CARE IF:   You feel confused.   Your vision is blurry.  You feel faint or pass out.   You have a severe headache.   You have severe abdominal, pelvic, or back pain.   You have chest pain, shortness of breath, or an irregular or fast heartbeat.   You are unable to urinate or you urinate less than normal.   You develop abnormal bleeding, such as bleeding from the rectum, vagina, nose, lungs, or nipples.  You vomit blood.   You have thoughts about harming yourself or committing suicide.   You are worried that you might harm someone else.    This information is not intended to replace advice given to you by your health care provider. Make sure you discuss any questions you have with your health care provider.   Document Released: 11/03/2006 Document Revised: 01/27/2014 Document Reviewed: 05/10/2013 Elsevier Interactive Patient Education [employer]

## 2015-04-02 NOTE — Progress Notes (Addendum)
Subjective:    Patient ID: Mary Hamilton, female    DOB: 1975-12-07, 40 y.o.   MRN: [id number] By signing my name below, I, Javier Dockerobert Ryan Halas, attest that this documentation has been prepared under the direction and in the presence of Meredith StaggersJeffrey Villa Burgin, MD. Electronically Signed: Javier Dockerobert Ryan Halas, ER Scribe. 04/02/2015. 3:11 PM.  Chief Complaint  Patient presents with  . Leg Pain    hurting from hips down on both legs x 4 weeks now having burning sensation from knees down for two weeks  . Hand Pain    has bvurning sensation in hands also   HPI HPI Comments: Mary AntonJanie Stipe is a 40 y.o. female who [facility] complaining of bilateral leg pain and burning, as well as burning in the bilateral hands. Her leg pain has come and gone and gone for the last two months. The burning sensation in her legs has been essentially constant for the last month. The leg pain and burning stops at her ankles. The burning sensation in her hands started two weeks ago and comes and goes.She has no hx of DM or thyroid issues that she is aware of. She has a past hx of anemia. She has no family hx of cardiac disease, but a long family hx of diabetes and prediabetes. She is not aware of any recent back injuries. Her work is primarily seated with no major recent changes. She has not taken any medications for her sx.   She also states she is chronically fatigued, worse for the last month.   She also states she gets light headed from time to time, and has been having intermittent episodes of blurry vision, that are usually followed by a HA. These sx have happened as much as once a week, but as infrequently as once per month over the last two years.   She also states she feels her heart race from time to time with associated sensation in her upper chest and neck that is not pain. She was watching TV when it happened last time and she was not stressed out.   Her period has been normal. She has no increased stress in her life  and has not been anxious lately, though she does have a past hx of anxiety.   Patient Active Problem List   Diagnosis Date Noted  . Migraines 11/21/2011  . Endometritis 10/24/2011   Past Medical History  Diagnosis Date  . Anemia    History reviewed. No pertinent past surgical history. No Known Allergies Prior to Admission medications   Medication Sig Start Date End Date Taking? Authorizing Provider  ALPRAZolam Prudy Feeler(XANAX) 0.25 MG tablet TAKE 1 TABLET AT BEDTIME AS NEEDED FOR SLEEP Patient not taking: Reported on 04/02/2015 08/02/13   Harrington ChallengerNancy J Young, NP  diazepam (VALIUM) 5 MG tablet Take 1 tablet (5 mg total) by mouth every 6 (six) hours as needed for anxiety. Patient not taking: Reported on 04/02/2015 10/27/12   Harrington ChallengerNancy J Young, NP  eletriptan (RELPAX) 20 MG tablet Take 1 tablet (20 mg total) by mouth as needed for migraine or headache. May repeat in 2 hours if headache persists or recurs. Patient not taking: Reported on 04/02/2015 12/27/14   Harrington ChallengerNancy J Young, NP   Social History   Social History  . Marital Status: Single    Spouse Name: N/A  . Number of Children: N/A  . Years of Education: N/A   Occupational History  . Not on file.   Social History Main Topics  .  Smoking status: Never Smoker   . Smokeless tobacco: Never Used  . Alcohol Use: No  . Drug Use: No  . Sexual Activity: Not Currently    Birth Control/ Protection:    Other Topics Concern  . Not on file   Social History Narrative    Review of Systems  Constitutional: Positive for fatigue. Negative for fever and chills.  Cardiovascular: Positive for palpitations.  Musculoskeletal: Positive for myalgias and back pain.  Skin: Negative for color change, rash and wound.  Neurological: Negative for weakness and numbness.  Psychiatric/Behavioral: Negative for dysphoric mood. The patient is not nervous/anxious.       Objective:  BP 90/68 mmHg  Pulse 76  Temp(Src) 98.6 F (37 C) (Oral)  Resp 16  Ht 5\' 5"  (1.651 m)  Wt  137 lb (62.143 kg)  BMI 22.80 kg/m2  SpO2 98%  LMP 04/01/2015  Physical Exam  Constitutional: She is oriented to person, place, and time. She appears well-developed and well-nourished. No distress.  HENT:  Head: Normocephalic and atraumatic.  Mouth/Throat: Oropharynx is clear and moist. No oropharyngeal exudate.  Eyes: Pupils are equal, round, and reactive to light.  Neck: Neck supple. No thyromegaly present.  Cardiovascular: Normal rate, regular rhythm and normal heart sounds.   No murmur heard. Pulmonary/Chest: Effort normal and breath sounds normal. No respiratory distress. She has no wheezes.  Musculoskeletal: Normal range of motion.  Lumbar spine non tender. Full ROM in lumbar spine. Some slight chest pain with spinal rotation.   Lymphadenopathy:    She has no cervical adenopathy.  Neurological: She is alert and oriented to person, place, and time. Coordination normal.  DP pulse 2+. Toes are warm. NVI distally. Negative babinski, negative seated straight leg raise. Sensation intact lower extremities. Hands sensation intact. Radial pulses 2+. Reflexes at biceps, triceps and brachioradialis are 2+. Full ROM at c-spine that does not reproduce her hand sx, but does cause some anterior and posterior neck and upper back pain.   Skin: Skin is warm and dry. She is not diaphoretic.  Psychiatric: She has a normal mood and affect. Her behavior is normal.  Nursing note and vitals reviewed.  EKG read by Dr. Neva Seat: Sinus rhythm rate 69, short PR syndrome with PR of 112. No Ectopy. No acute findings.  Results for orders placed or performed in visit on 04/02/15  POCT CBC  Result Value Ref Range   WBC 6.0 4.6 - 10.2 K/uL   Lymph, poc 1.6 0.6 - 3.4   POC LYMPH PERCENT 26.8 10 - 50 %L   MID (cbc) 0.6 0 - 0.9   POC MID % 10.0 0 - 12 %M   POC Granulocyte 3.8 2 - 6.9   Granulocyte percent 63.2 37 - 80 %G   RBC 4.67 4.04 - 5.48 M/uL   Hemoglobin 14.5 12.2 - 16.2 g/dL   HCT, POC 40.9 81.1 - 47.9  %   MCV 89.4 80 - 97 fL   MCH, POC 31.0 27 - 31.2 pg   MCHC 34.7 31.8 - 35.4 g/dL   RDW, POC 91.4 %   Platelet Count, POC 209 142 - 424 K/uL   MPV 8.4 0 - 99.8 fL  POCT glucose (manual entry)  Result Value Ref Range   POC Glucose 109 (A) 70 - 99 mg/dl  Dg Lumbar Spine 2-3 Views  04/02/2015  CLINICAL DATA:  Low back pain with bilateral lower extremity radiculopathy 2 months. No injury. EXAM: LUMBAR SPINE - 2-3 VIEW COMPARISON:  None. FINDINGS: Vertebral body alignment, heights and disc space heights are within normal. There is no compression fracture or subluxation. IMPRESSION: Negative. Electronically Signed   By: Elberta Fortis M.D.   On: 04/02/2015 15:41  [ Assessment & Plan:  Kyrielle Urbanski is a 40 y.o. female Bilateral low back pain, with sciatica presence unspecified - Plan: DG Lumbar Spine 2-3 Views, Ambulatory referral to Neurology Burning sensation in lower extremity - Plan: DG Lumbar Spine 2-3 Views, POCT glucose (manual entry), Ambulatory referral to Neurology Epigastric burning sensation - Plan: POCT glucose (manual entry), Ambulatory referral to Neurology  - No concerning findings on lumbar spine x-ray, neurovascular intact distally, reflexes intact distally, negative straight leg raise testing, doubt sciatica. Will check TSH, but also refer to neurology for further evaluation to determine if other imaging needed or nerve conduction studies.   Blurry vision, bilateral - Plan: POCT glucose (manual entry), Ambulatory referral to Neurology Nonintractable headache, unspecified chronicity pattern, unspecified headache type - Plan: Ambulatory referral to Neurology  - Intermittent blurry vision, intermittent headache, eval with neurology as above. RTC precautions if worsening.  Heart palpitations - Plan: POCT CBC, TSH, EKG 12-Lead, Ambulatory referral to Cardiology Other fatigue - Plan: POCT CBC, TSH, Basic metabolic panel, POCT glucose (manual entry), Ambulatory referral to Neurology,  Ambulatory referral to Cardiology Shortened PR interval - Plan: Ambulatory referral to Cardiology  - Short PR interval, intermittent palpitations and fatigue. We'll have evaluated by neurology for the fatigue, headache, and other symptoms above, but with palpitations we will have evaluated by cardiology. ER/RTC precautions  No orders of the defined types were placed in this encounter.   Patient Instructions  IF you received an x-ray today, you will receive an invoice from [facility]. Please contact [facility] at [phone] with questions or concerns regarding your invoice.   IF you received labwork today, you will receive an invoice from [facility]. Please contact [facility] at [phone] with questions or concerns regarding your invoice.   Our billing staff will not be able to assist you with questions regarding bills from these companies.  You will be contacted with the lab results as soon as they are available. The fastest way to get your results is to activate your My Chart account. Instructions are located on the last page of this paperwork. If you have not heard from [place] regarding the results in 2 weeks, please contact this office.  Your blood sugar and blood count were normal here in the office. X-ray appears okay as well. We will check some other blood tests and was sent off, but will need to see neurology for further evaluation of your symptoms, and cardiology for the heart palpitations. We will order these referrals.   Return to the clinic or go to the nearest emergency room if any of your symptoms worsen or new symptoms occur.   Palpitations A palpitation is the feeling that your heartbeat is irregular or is faster than normal. It may feel like your heart is fluttering or skipping a beat. Palpitations are usually not a serious problem. However, in some cases, you may need further medical evaluation. CAUSES  Palpitations can be caused  by:  Smoking.  Caffeine or other stimulants, such as diet pills or energy drinks.  Alcohol.  Stress and anxiety.  Strenuous physical activity.  Fatigue.  Certain medicines.  Heart disease, especially if you have a history of irregular heart rhythms (arrhythmias), such as atrial fibrillation, atrial flutter, or supraventricular tachycardia.  An  improperly working pacemaker or defibrillator. DIAGNOSIS  To find the cause of your palpitations, your health care provider will take your medical history and perform a physical exam. Your health care provider may also have you take a test called an ambulatory electrocardiogram (ECG). An ECG records your heartbeat patterns over a 24-hour period. You may also have other tests, such as:  Transthoracic echocardiogram (TTE). During echocardiography, sound waves are used to evaluate how blood flows through your heart.  Transesophageal echocardiogram (TEE).  Cardiac monitoring. This allows your health care provider to monitor your heart rate and rhythm in real time.  Holter monitor. This is a portable device that records your heartbeat and can help diagnose heart arrhythmias. It allows your health care provider to track your heart activity for several days, if needed.  Stress tests by exercise or by giving medicine that makes the heart beat faster. TREATMENT  Treatment of palpitations depends on the cause of your symptoms and can vary greatly. Most cases of palpitations do not require any treatment other than time, relaxation, and monitoring your symptoms. Other causes, such as atrial fibrillation, atrial flutter, or supraventricular tachycardia, usually require further treatment. HOME CARE INSTRUCTIONS   Avoid:  Caffeinated coffee, tea, soft drinks, diet pills, and energy drinks.  Chocolate.  Alcohol.  Stop smoking if you smoke.  Reduce your stress and anxiety. Things that can help you relax include:  A method of controlling things in  your body, such as your heartbeats, with your mind (biofeedback).  Yoga.  Meditation.  Physical activity such as swimming, jogging, or walking.  Get plenty of rest and sleep. SEEK MEDICAL CARE IF:   You continue to have a fast or irregular heartbeat beyond 24 hours.  Your palpitations occur more often. SEEK IMMEDIATE MEDICAL CARE IF:  You have chest pain or shortness of breath.  You have a severe headache.  You feel dizzy or you faint. MAKE SURE YOU:  Understand these instructions.  Will watch your condition.  Will get help right away if you are not doing well or get worse.   This information is not intended to replace advice given to you by your health care provider. Make sure you discuss any questions you have with your health care provider.   Document Released: 01/04/2000 Document Revised: 01/11/2013 Document Reviewed: 03/07/2011 Elsevier Interactive Patient Education 2016 [employer].    Fatigue Fatigue is feeling tired all of the time, a lack of energy, or a lack of motivation. Occasional or mild fatigue is often a normal response to activity or life in general. However, long-lasting (chronic) or extreme fatigue may indicate an underlying medical condition. HOME CARE INSTRUCTIONS  Watch your fatigue for any changes. The following actions may help to lessen any discomfort you are feeling:  Talk to your health care provider about how much sleep you need each night. Try to get the required amount every night.  Take medicines only as directed by your health care provider.  Eat a healthy and nutritious diet. Ask your health care provider if you need help changing your diet.  Drink enough fluid to keep your urine clear or pale yellow.  Practice ways of relaxing, such as yoga, meditation, massage therapy, or acupuncture.  Exercise regularly.   Change situations that cause you stress. Try to keep your work and personal routine reasonable.  Do not abuse illegal  drugs.  Limit alcohol intake to no more than 1 drink per day for nonpregnant women and 2 drinks per day  for men. One drink equals 12 ounces of beer, 5 ounces of wine, or 1 ounces of hard liquor.  Take a multivitamin, if directed by your health care provider. SEEK MEDICAL CARE IF:   Your fatigue does not get better.  You have a fever.   You have unintentional weight loss or gain.  You have headaches.   You have difficulty:   Falling asleep.  Sleeping throughout the night.  You feel angry, guilty, anxious, or sad.   You are unable to have a bowel movement (constipation).   You skin is dry.   Your legs or another part of your body is swollen.  SEEK IMMEDIATE MEDICAL CARE IF:   You feel confused.   Your vision is blurry.  You feel faint or pass out.   You have a severe headache.   You have severe abdominal, pelvic, or back pain.   You have chest pain, shortness of breath, or an irregular or fast heartbeat.   You are unable to urinate or you urinate less than normal.   You develop abnormal bleeding, such as bleeding from the rectum, vagina, nose, lungs, or nipples.  You vomit blood.   You have thoughts about harming yourself or committing suicide.   You are worried that you might harm someone else.    This information is not intended to replace advice given to you by your health care provider. Make sure you discuss any questions you have with your health care provider.   Document Released: 11/03/2006 Document Revised: 01/27/2014 Document Reviewed: 05/10/2013 Elsevier Interactive Patient Education [employer]       I personally performed the services described in this documentation, which was scribed in my presence. The recorded information has been reviewed and considered, and addended by me as needed.

## 2015-05-01 ENCOUNTER — Ambulatory Visit: Payer: Managed Care, Other (non HMO) | Admitting: Neurology

## 2015-05-04 ENCOUNTER — Encounter: Payer: Self-pay | Admitting: Cardiology

## 2015-05-11 ENCOUNTER — Ambulatory Visit: Payer: Managed Care, Other (non HMO) | Admitting: Cardiology

## 2015-12-28 ENCOUNTER — Encounter: Payer: Self-pay | Admitting: Women's Health

## 2015-12-28 ENCOUNTER — Ambulatory Visit (INDEPENDENT_AMBULATORY_CARE_PROVIDER_SITE_OTHER): Payer: Managed Care, Other (non HMO) | Admitting: Women's Health

## 2015-12-28 VITALS — BP 122/80 | Ht 65.0 in | Wt 141.0 lb

## 2015-12-28 DIAGNOSIS — Z01419 Encounter for gynecological examination (general) (routine) without abnormal findings: Secondary | ICD-10-CM | POA: Diagnosis not present

## 2015-12-28 DIAGNOSIS — R05 Cough: Secondary | ICD-10-CM

## 2015-12-28 DIAGNOSIS — R059 Cough, unspecified: Secondary | ICD-10-CM

## 2015-12-28 DIAGNOSIS — Z13220 Encounter for screening for lipoid disorders: Secondary | ICD-10-CM

## 2015-12-28 LAB — CBC WITH DIFFERENTIAL/PLATELET
BASOS PCT: 0 %
Basophils Absolute: 0 cells/uL (ref 0–200)
EOS PCT: 3 %
Eosinophils Absolute: 192 cells/uL (ref 15–500)
HEMATOCRIT: 43.9 % (ref 35.0–45.0)
HEMOGLOBIN: 14.4 g/dL (ref 11.7–15.5)
LYMPHS ABS: 1728 {cells}/uL (ref 850–3900)
Lymphocytes Relative: 27 %
MCH: 29.9 pg (ref 27.0–33.0)
MCHC: 32.8 g/dL (ref 32.0–36.0)
MCV: 91.1 fL (ref 80.0–100.0)
MONO ABS: 576 {cells}/uL (ref 200–950)
MPV: 10 fL (ref 7.5–12.5)
Monocytes Relative: 9 %
Neutro Abs: 3904 cells/uL (ref 1500–7800)
Neutrophils Relative %: 61 %
Platelets: 310 10*3/uL (ref 140–400)
RBC: 4.82 MIL/uL (ref 3.80–5.10)
RDW: 13.1 % (ref 11.0–15.0)
WBC: 6.4 10*3/uL (ref 3.8–10.8)

## 2015-12-28 MED ORDER — HYDROCORTISONE ACE-PRAMOXINE 2.5-1 % RE CREA: 1.0000 "application " | TOPICAL_CREAM | Freq: Three times a day (TID) | RECTAL | 0 refills | Status: DC

## 2015-12-28 MED ORDER — GUAIFENESIN-CODEINE 100-10 MG/5ML PO SOLN: 5.0000 mL | Freq: Three times a day (TID) | ORAL | 0 refills | Status: DC | PRN

## 2015-12-28 NOTE — Addendum Note (Signed)
Addended by: Kem ParkinsonBARNES, Reign Bartnick on: 12/28/2015 10:59 AM   Modules accepted: Orders

## 2015-12-28 NOTE — Patient Instructions (Signed)

## 2015-12-28 NOTE — Progress Notes (Signed)
Jeanella AntonJanie Steller May 01, 1975 [id number]    History:    Presents for annual exam with complaint of cough and sore throat from coughing for 1 week.  Cough worse at night, difficult to fall asleep.  Complaint of occasional small amount of rectal bleeding with bowel movements.  Denies black stool.  Regular monthly cycle.  Not sexually active greater than 1 year.  History of migraines, none in the last year.  Normal Pap history.    Past medical history, past surgical history, family history and social history were all reviewed and documented in the EPIC chart. Works at a [employer], educationalfurniture manufacturer.  Daughter, 2218, Consulting civil engineerstudent at [employer], Primary school teachergraphic design. Mother diabetes.  Father, sister, brother migraines.   ROS:  A ROS was performed and pertinent positives and negatives are included.  Exam:  Vitals:   12/28/15 0934  BP: 122/80  Weight: 141 lb (64 kg)  Height: 5\' 5"  (1.651 m)   Body mass index is 23.46 kg/m.   General appearance:  Normal Thyroid:  Symmetrical, normal in size, without palpable masses or nodularity. Respiratory  Auscultation:  Clear without wheezing or rhonchi Cardiovascular  Auscultation:  Regular rate, without rubs, murmurs or gallops  Edema/varicosities:  Not grossly evident Abdominal  Soft,nontender, without masses, guarding or rebound.  Liver/spleen:  No organomegaly noted  Hernia:  None appreciated  Skin  Inspection:  Grossly normal   Breasts: Examined lying and sitting.     Right: Without masses, retractions, discharge or axillary adenopathy.     Left: Without masses, retractions, discharge or axillary adenopathy. Gentitourinary   Inguinal/mons:  Normal without inguinal adenopathy  External genitalia:  Normal  BUS/Urethra/Skene's glands:  Normal  Vagina:  Normal  Cervix:  Normal  Uterus:   normal in size, shape and contour.  Midline and mobile  Adnexa/parametria:     Rt: Without masses or tenderness.   Lt: Without masses or tenderness.  Anus and perineum: Normal,  small Nonthrombosed hemorrhoid  Digital rectal exam: Normal sphincter tone without palpated masses or tenderness  Assessment/Plan:  40 y.o. SHF for annual exam with complaint of cough and sore throat for 1 week.  G1P1.   Monthly cycle, condoms Upper Respiratory Infection Migraines, rare Hemorrhoid   Plan: Guaifenesin-codeine 100-10mg /65ml, take 5 mls 3 times daily as needed for cough.  Pap with HR HPV. CBC , CMP, Lipid panel, UA.  Not currently sexually active greater than one year, denies need for STD screen. SBEs, heart health diet and continue daily exercises encouraged. Analpram 2.5/1% cream prescription given. Instructed to call if bright red bleeding persists, change in color of stool, or changes.      Harrington ChallengerYOUNG,Laurissa Cowper J WHNP, 10:14 AM 12/28/2015

## 2015-12-29 LAB — URINALYSIS W MICROSCOPIC + REFLEX CULTURE
Bilirubin Urine: NEGATIVE
CASTS: NONE SEEN [LPF]
CRYSTALS: NONE SEEN [HPF]
Glucose, UA: NEGATIVE
HGB URINE DIPSTICK: NEGATIVE
Nitrite: NEGATIVE
Specific Gravity, Urine: 1.026 (ref 1.001–1.035)
YEAST: NONE SEEN [HPF]
pH: 6 (ref 5.0–8.0)

## 2015-12-29 LAB — LIPID PANEL
Cholesterol: 143 mg/dL (ref <200)
HDL: 62 mg/dL (ref >50)
LDL CALC: 71 mg/dL (ref <100)
Total CHOL/HDL Ratio: 2.3 Ratio (ref <5.0)
Triglycerides: 50 mg/dL (ref <150)
VLDL: 10 mg/dL (ref <30)

## 2015-12-29 LAB — COMPREHENSIVE METABOLIC PANEL
ALBUMIN: 4.2 g/dL (ref 3.6–5.1)
ALK PHOS: 44 U/L (ref 33–115)
ALT: 9 U/L (ref 6–29)
AST: 15 U/L (ref 10–30)
BILIRUBIN TOTAL: 0.6 mg/dL (ref 0.2–1.2)
BUN: 7 mg/dL (ref 7–25)
CALCIUM: 9 mg/dL (ref 8.6–10.2)
CO2: 23 mmol/L (ref 20–31)
CREATININE: 0.78 mg/dL (ref 0.50–1.10)
Chloride: 104 mmol/L (ref 98–110)
GLUCOSE: 85 mg/dL (ref 65–99)
Potassium: 4.2 mmol/L (ref 3.5–5.3)
SODIUM: 139 mmol/L (ref 135–146)
Total Protein: 7.1 g/dL (ref 6.1–8.1)

## 2015-12-30 LAB — URINE CULTURE: ORGANISM ID, BACTERIA: NO GROWTH

## 2015-12-31 ENCOUNTER — Encounter: Payer: Self-pay | Admitting: Women's Health

## 2016-01-01 LAB — PAP, TP IMAGING W/ HPV RNA, RFLX HPV TYPE 16,18/45: HPV mRNA, High Risk: DETECTED — AB

## 2016-01-03 LAB — HPV TYPE 16 AND 18/45 RNA
HPV TYPE 18/45 RNA: NOT DETECTED
HPV Type 16 RNA: NOT DETECTED

## 2016-04-18 ENCOUNTER — Ambulatory Visit (INDEPENDENT_AMBULATORY_CARE_PROVIDER_SITE_OTHER): Payer: Managed Care, Other (non HMO) | Admitting: Family Medicine

## 2016-04-18 VITALS — BP 111/75 | HR 72 | Temp 98.3°F | Resp 16 | Ht 65.0 in | Wt 145.8 lb

## 2016-04-18 DIAGNOSIS — J301 Allergic rhinitis due to pollen: Secondary | ICD-10-CM | POA: Diagnosis not present

## 2016-04-18 DIAGNOSIS — R5383 Other fatigue: Secondary | ICD-10-CM | POA: Diagnosis not present

## 2016-04-18 DIAGNOSIS — R635 Abnormal weight gain: Secondary | ICD-10-CM | POA: Diagnosis not present

## 2016-04-18 NOTE — Patient Instructions (Addendum)
Recommend trying Allegra for allergies; one tablet daily. Also consider using Flonase nasal spray   Preventing Unhealthy Weight Gain, Adult Staying at a healthy weight is important. When fat builds up in your body, you may become overweight or obese. These conditions put you at greater risk for developing certain health problems, such as heart disease, diabetes, sleeping problems, joint problems, and some cancers. Unhealthy weight gain is often the result of making unhealthy choices in what you eat. It is also a result of not getting enough exercise. You can make changes to your lifestyle to prevent obesity and stay as healthy as possible. What nutrition changes can be made? To maintain a healthy weight and prevent obesity:  Eat only as much as your body needs. To do this:  Pay attention to signs that you are hungry or full. Stop eating as soon as you feel full.  If you feel hungry, try drinking water first. Drink enough water so your urine is clear or pale yellow.  Eat smaller portions.  Look at serving sizes on food labels. Most foods contain more than one serving per container.  Eat the recommended amount of calories for your gender and activity level. While most active people should eat around 2,000 calories per day, if you are trying to lose weight or are not very active, you main need to eat less calories. Talk to your health care provider or dietitian about how many calories you should eat each day.  Choose healthy foods, such as:  Fruits and vegetables. Try to fill at least half of your plate at each meal with fruits and vegetables.  Whole grains, such as whole wheat bread, brown rice, and quinoa.  Lean meats, such as chicken or fish.  Other healthy proteins, such as beans, eggs, or tofu.  Healthy fats, such as nuts, seeds, fatty fish, and olive oil.  Low-fat or fat-free dairy.  Check food labels and avoid food and drinks that:  Are high in calories.  Have added  sugar.  Are high in sodium.  Have saturated fats or trans fats.  Limit how much you eat of the following foods:  Prepackaged meals.  Fast food.  Fried foods.  Processed meat, such as bacon, sausage, and deli meats.  Fatty cuts of red meat and poultry with skin.  Cook foods in healthier ways, such as by baking, broiling, or grilling.  When grocery shopping, try to shop around the outside of the store. This helps you buy mostly fresh foods and avoid canned and prepackaged foods. What lifestyle changes can be made?  Exercise at least 30 minutes 5 or more days each week. Exercising includes brisk walking, yard work, biking, running, swimming, and team sports like basketball and soccer. Ask your health care provider which exercises are safe for you.  Do not use any products that contain nicotine or tobacco, such as cigarettes and e-cigarettes. If you need help quitting, ask your health care provider.  Limit alcohol intake to no more than 1 drink a day for nonpregnant women and 2 drinks a day for men. One drink equals 12 oz of beer, 5 oz of wine, or 1 oz of hard liquor.  Try to get 7-9 hours of sleep each night. What other changes can be made?  Keep a food and activity journal to keep track of:  What you ate and how many calories you had. Remember to count sauces, dressings, and side dishes.  Whether you were active, and what exercises you did.  Your calorie, weight, and activity goals.  Check your weight regularly. Track any changes. If you notice you have gained weight, make changes to your diet or activity routine.  Avoid taking weight-loss medicines or supplements. Talk to your health care provider before starting any new medicine or supplement.  Talk to your health care provider before trying any new diet or exercise plan. Why are these changes important? Eating healthy, staying active, and having healthy habits not only help prevent obesity, they also:  Help you to  manage stress and emotions.  Help you to connect with friends and family.  Improve your self-esteem.  Improve your sleep.  Prevent long-term health problems. What can happen if changes are not made? Being obese or overweight can cause you to develop joint or bone problems, which can make it hard for you to stay active or do activities you enjoy. Being obese or overweight also puts stress on your heart and lungs and can lead to health problems like diabetes, heart disease, and some cancers. Where to find more information: Talk with your health care provider or a dietitian about healthy eating and healthy lifestyle choices. You may also find other information through these resources:  U.S. Department of Agriculture MyPlate: https://ball-collins.biz/  American Heart Association: www.heart.org  Centers for Disease Control and Prevention: FootballExhibition.com.br Summary  Staying at a healthy weight is important. It helps prevent certain diseases and health problems, such as heart disease, diabetes, joint problems, sleep disorders, and some cancers.  Being obese or overweight can cause you to develop joint or bone problems, which can make it hard for you to stay active or do activities you enjoy.  You can prevent unhealthy weight gain by eating a healthy diet, exercising regularly, not smoking, limiting alcohol, and getting enough sleep.  Talk with your health care provider or a dietitian for guidance about healthy eating and healthy lifestyle choices. This information is not intended to replace advice given to you by your health care provider. Make sure you discuss any questions you have with your health care provider. Document Released: 01/08/2016 Document Revised: 01/08/2016 Document Reviewed: 01/08/2016 Elsevier Interactive Patient Education  2017 [employer].     IF you received an x-ray today, you will receive an invoice from [facility]. Please contact [facility] at  [phone] with questions or concerns regarding your invoice.   IF you received labwork today, you will receive an invoice from [facility]. Please contact [facility] at [phone] with questions or concerns regarding your invoice.   Our billing staff will not be able to assist you with questions regarding bills from these companies.  You will be contacted with the lab results as soon as they are available. The fastest way to get your results is to activate your My Chart account. Instructions are located on the last page of this paperwork. If you have not heard from [place] regarding the results in 2 weeks, please contact this office.

## 2016-04-18 NOTE — Progress Notes (Signed)
Subjective:    Patient ID: Mary Hamilton, female    DOB: 1975/11/06, 41 y.o.   MRN: [id number]  04/18/2016  Thyroid check   HPI This 41 y.o. female presents for evaluation of weight gain, fatigue. Onset two months ago.  No stressors.  Exercising 2-3 days per week but cannot motivate due to lack of energy.  No energy.  Sleeping 7 hours per night.  Waking up 2-3 in mroning. Awake for one hour.  Sleeps well.  No change in caffeine intake.  Eating clean.  Menses are normal.  Cracks in corner of lips for two weeks.     Immunization History  Administered Date(s) Administered  . Tdap 11/21/2011   BP Readings from Last 3 Encounters:  04/18/16 111/75  12/28/15 122/80  04/02/15 90/68   Wt Readings from Last 3 Encounters:  04/18/16 145 lb 12.8 oz (66.1 kg)  12/28/15 141 lb (64 kg)  04/02/15 137 lb (62.1 kg)    Review of Systems  Constitutional: Positive for fatigue and unexpected weight change. Negative for chills, diaphoresis and fever.  Eyes: Negative for visual disturbance.  Respiratory: Negative for cough and shortness of breath.   Cardiovascular: Negative for chest pain, palpitations and leg swelling.  Gastrointestinal: Negative for abdominal pain, constipation, diarrhea, nausea and vomiting.  Endocrine: Negative for cold intolerance, heat intolerance, polydipsia, polyphagia and polyuria.  Skin: Positive for rash and wound.  Neurological: Negative for dizziness, tremors, seizures, syncope, facial asymmetry, speech difficulty, weakness, light-headedness, numbness and headaches.  Psychiatric/Behavioral: Positive for sleep disturbance. Negative for dysphoric mood. The patient is not nervous/anxious.     Past Medical History:  Diagnosis Date  . Anemia   . Migraine    No past surgical history on file. No Known Allergies Current Outpatient Prescriptions  Medication Sig Dispense Refill  . hydrocortisone-pramoxine (ANALPRAM HC) 2.5-1 % rectal cream Place 1 application rectally 3  (three) times daily. (Patient not taking: Reported on 04/18/2016) 30 g 0   No current facility-administered medications for this visit.    Social History   Social History  . Marital status: Single    Spouse name: N/A  . Number of children: N/A  . Years of education: N/A   Occupational History  . Not on file.   Social History Main Topics  . Smoking status: Never Smoker  . Smokeless tobacco: Never Used  . Alcohol use No  . Drug use: No  . Sexual activity: Not Currently    Birth control/ protection:    Other Topics Concern  . Not on file   Social History Narrative   Marital status: single      Employment: Engineer, petroleum      Tobacco: none      Alcohol; social      Exercise:  Treadmill and cardio kickboxing.     Family History  Problem Relation Age of Onset  . Diabetes Mother   . Migraines Father   . Migraines Sister   . Migraines Brother   . Diabetes Maternal Aunt   . Diabetes Maternal Uncle   . Diabetes Maternal Grandmother   . Diabetes Maternal Grandfather        Objective:    BP 111/75   Pulse 72   Temp 98.3 F (36.8 C) (Oral)   Resp 16   Ht  (1.651 m)   Wt 145 lb 12.8 oz (66.1 kg)   SpO2 100%   BMI 24.26 kg/m  Physical Exam  Constitutional: She is oriented  to person, place, and time. She appears well-developed and well-nourished. No distress.  HENT:  Head: Normocephalic and atraumatic.  Right Ear: External ear normal.  Left Ear: External ear normal.  Nose: Nose normal.  Mouth/Throat: Oropharynx is clear and moist.  Eyes: Conjunctivae and EOM are normal. Pupils are equal, round, and reactive to light.  Neck: Normal range of motion. Neck supple. Carotid bruit is not present. No thyromegaly present.  Cardiovascular: Normal rate, regular rhythm, normal heart sounds and intact distal pulses.  Exam reveals no gallop and no friction rub.   No murmur heard. Pulmonary/Chest: Effort normal and breath sounds normal. She has no  wheezes. She has no rales.  Abdominal: Soft. Bowel sounds are normal. She exhibits no distension and no mass. There is no tenderness. There is no rebound and no guarding.  Lymphadenopathy:    She has no cervical adenopathy.  Neurological: She is alert and oriented to person, place, and time. No cranial nerve deficit.  Skin: Skin is warm and dry. No rash noted. She is not diaphoretic. No erythema. No pallor.  Psychiatric: She has a normal mood and affect. Her behavior is normal.   Depression screen [facility] 2/9 04/18/2016 04/02/2015  Decreased Interest 0 0  Down, Depressed, Hopeless 0 0  PHQ - 2 Score 0 0        Assessment & Plan:   1. Weight gain   2. Other fatigue   3. Acute seasonal allergic rhinitis due to pollen    -new onset; obtain labs including TSH and free T4; also will obtain B12 and Vitamin D. -currently suffering with allergic rhinitis; recommend daily oral antihistamine such as Allegra; also recommend Flonase. -recommend increasing exercise to five days per week for 30-45 minutes.  Recommend [email or web address] for food diary and monitoring exercise. -denies underlying depressive or anxiety symptoms; having some insomnia that is contributing to fatigue; recommend sleep hygiene. Consider Melatonin.   Orders Placed This Encounter  Procedures  . TSH  . T4, free  . CBC with Differential/Platelet  . Comprehensive metabolic panel  . VITAMIN D 25 Hydroxy (Vit-D Deficiency, Fractures)  . Vitamin B12   No orders of the defined types were placed in this encounter.   No Follow-up on file.   Nataliah Hatlestad Paulita Fujita, M.D. Primary Care at [facility] previously [facility] [address] [place], [place]  [phone] phone [phone] fax

## 2016-04-19 LAB — TSH: TSH: 1.74 u[IU]/mL (ref 0.450–4.500)

## 2016-04-19 LAB — CBC WITH DIFFERENTIAL/PLATELET
BASOS: 1 %
Basophils Absolute: 0 10*3/uL (ref 0.0–0.2)
EOS (ABSOLUTE): 0.2 10*3/uL (ref 0.0–0.4)
EOS: 3 %
HEMATOCRIT: 42 % (ref 34.0–46.6)
HEMOGLOBIN: 14.4 g/dL (ref 11.1–15.9)
IMMATURE GRANS (ABS): 0 10*3/uL (ref 0.0–0.1)
IMMATURE GRANULOCYTES: 0 %
LYMPHS: 36 %
Lymphocytes Absolute: 2 10*3/uL (ref 0.7–3.1)
MCH: 30 pg (ref 26.6–33.0)
MCHC: 34.3 g/dL (ref 31.5–35.7)
MCV: 88 fL (ref 79–97)
MONOCYTES: 8 %
Monocytes Absolute: 0.4 10*3/uL (ref 0.1–0.9)
NEUTROS ABS: 2.9 10*3/uL (ref 1.4–7.0)
NEUTROS PCT: 52 %
Platelets: 273 10*3/uL (ref 150–379)
RBC: 4.8 x10E6/uL (ref 3.77–5.28)
RDW: 14.1 % (ref 12.3–15.4)
WBC: 5.5 10*3/uL (ref 3.4–10.8)

## 2016-04-19 LAB — COMPREHENSIVE METABOLIC PANEL
ALT: 11 IU/L (ref 0–32)
AST: 17 IU/L (ref 0–40)
Albumin/Globulin Ratio: 1.6 (ref 1.2–2.2)
Albumin: 4.5 g/dL (ref 3.5–5.5)
Alkaline Phosphatase: 42 IU/L (ref 39–117)
BILIRUBIN TOTAL: 0.6 mg/dL (ref 0.0–1.2)
BUN/Creatinine Ratio: 10 (ref 9–23)
BUN: 8 mg/dL (ref 6–20)
CHLORIDE: 102 mmol/L (ref 96–106)
CO2: 21 mmol/L (ref 18–29)
CREATININE: 0.82 mg/dL (ref 0.57–1.00)
Calcium: 9.4 mg/dL (ref 8.7–10.2)
GFR calc non Af Amer: 91 mL/min/{1.73_m2} (ref >59)
GFR, EST AFRICAN AMERICAN: 105 mL/min/{1.73_m2} (ref >59)
GLUCOSE: 82 mg/dL (ref 65–99)
Globulin, Total: 2.8 g/dL (ref 1.5–4.5)
Potassium: 4.5 mmol/L (ref 3.5–5.2)
Sodium: 142 mmol/L (ref 134–144)
TOTAL PROTEIN: 7.3 g/dL (ref 6.0–8.5)

## 2016-04-19 LAB — T4, FREE: Free T4: 1.29 ng/dL (ref 0.82–1.77)

## 2016-04-19 LAB — VITAMIN B12: Vitamin B-12: 624 pg/mL (ref 232–1245)

## 2016-04-19 LAB — VITAMIN D 25 HYDROXY (VIT D DEFICIENCY, FRACTURES): VIT D 25 HYDROXY: 44.8 ng/mL (ref 30.0–100.0)

## 2016-05-18 DIAGNOSIS — J301 Allergic rhinitis due to pollen: Secondary | ICD-10-CM | POA: Insufficient documentation

## 2016-05-20 ENCOUNTER — Telehealth: Payer: Self-pay

## 2016-05-20 MED ORDER — DIAZEPAM 5 MG PO TABS: 5.0000 mg | ORAL_TABLET | Freq: Four times a day (QID) | ORAL | 0 refills | Status: DC | PRN

## 2016-05-20 NOTE — Telephone Encounter (Signed)
Back in 2014 you prescribed Valium 5 mg. For her for anxiety with plane travel.  She has a flight this weekend and wondered if you would prescribe this again for her.

## 2016-05-20 NOTE — Telephone Encounter (Signed)
Rx called in 

## 2016-05-20 NOTE — Telephone Encounter (Signed)
Left message for patient letting her know Rx called in.

## 2016-05-20 NOTE — Telephone Encounter (Signed)
Yes, okay for Valium 5 mg when necessary with plan travel #10 no refills.

## 2016-06-02 ENCOUNTER — Encounter: Payer: Self-pay | Admitting: Family Medicine

## 2016-06-02 ENCOUNTER — Ambulatory Visit (INDEPENDENT_AMBULATORY_CARE_PROVIDER_SITE_OTHER): Payer: Managed Care, Other (non HMO) | Admitting: Family Medicine

## 2016-06-02 VITALS — BP 104/68 | HR 68 | Temp 97.8°F | Resp 18 | Ht 65.0 in | Wt 146.8 lb

## 2016-06-02 DIAGNOSIS — S060X0A Concussion without loss of consciousness, initial encounter: Secondary | ICD-10-CM | POA: Diagnosis not present

## 2016-06-02 DIAGNOSIS — M542 Cervicalgia: Secondary | ICD-10-CM | POA: Insufficient documentation

## 2016-06-02 MED ORDER — METHOCARBAMOL 500 MG PO TABS: 500.0000 mg | ORAL_TABLET | Freq: Two times a day (BID) | ORAL | 1 refills | Status: DC | PRN

## 2016-06-02 MED ORDER — MELOXICAM 15 MG PO TABS: 15.0000 mg | ORAL_TABLET | Freq: Every day | ORAL | 0 refills | Status: DC

## 2016-06-02 NOTE — Patient Instructions (Addendum)
Thank you for coming in,   Please follow up with Dr. Neva SeatGreene or Dr. Zachery DauerBarnes for you concussion.     Please feel free to call with any questions or concerns at any time, at [phone]. --Dr. Jordan LikesSchmitz   Concussion, Adult A concussion is a brain injury from a direct hit (blow) to the head or body. This injury causes the brain to shake quickly back and forth inside the skull. It is caused by:  A hit to the head.  A quick and sudden movement (jolt) of the head or neck. How fast you will get better from a concussion depends on many things like how bad your concussion was, what part of your brain was hurt, how old you are, and how healthy you were before the concussion. Recovery can take time. It is important to wait to return to activity until a doctor says it is safe and your symptoms are all gone. Follow these instructions at home: Activity   Limit activities that need a lot of thought or concentration. These include:  Homework or work for your job.  Watching TV.  Computer work.  Playing memory games and puzzles.  Rest. Rest helps the brain to heal. Make sure you:  Get plenty of sleep at night. Do not stay up late.  Go to bed at the same time every day.  Rest during the day. Take naps or rest breaks when you feel tired.  It can be dangerous if you get another concussion before the first one has healed Do not do activities that could cause a second concussion, such as riding a bike or playing sports.  Ask your doctor when you can return to your normal activities, like driving, riding a bike, or using machinery. Your ability to react may be slower. Do not do these activities if you are dizzy. Your doctor will likely give you a plan for slowly going back to activities. General instructions   Take over-the-counter and prescription medicines only as told by your doctor.  Do not drink alcohol until your doctor says you can.  If it is harder than usual to remember things, write them  down.  If you are easily distracted, try to do one thing at a time. For example, do not try to watch TV while making dinner.  Talk with family members or close friends when you need to make important decisions.  Watch your symptoms and tell other people to do the same. Other problems (complications) can happen after a concussion. Older adults with a brain injury may have a higher risk of serious problems, such as a blood clot in the brain.  Tell your teachers, school nurse, school counselor, coach, Event organiserathletic trainer, or work Production designer, theatre/television/filmmanager about your injury and symptoms. Tell them about what you can or cannot do. They should watch for:  More problems with attention or concentration.  More trouble remembering or learning new information.  More time needed to do tasks or assignments.  Being more annoyed (irritable) or having a harder time dealing with stress.  Any other symptoms that get worse.  Keep all follow-up visits as told by your health care provider. This is important. Prevention   It is very important that you donot get another brain injury, especially before you have healed. In rare cases, another injury can cause permanent brain damage, brain swelling, or death. You have the most risk if you get another head injury in the first 7-10 days after you were hurt before. To avoid injuries:  Wear a seat belt when you ride in a car.  Do not drink too much alcohol.  Avoid activities that could make you get a second concussion, like contact sports.  Wear a helmet when you do activities like:  Biking.  Skiing.  Skateboarding.  Skating.  Make your home safe by:  Removing things from the floor or stairs that could make you trip.  Using grab bars in bathrooms and handrails by stairs.  Placing non-slip mats on floors and in bathtubs.  Putting more light in dark areas. Contact a doctor if:  Your symptoms get worse.  You have new symptoms.  You keep having symptoms for more  than 2 weeks. Get help right away if:  You have bad headaches, or your headaches get worse.  You have weakness in any part of your body.  You have loss of feeling (numbness).  You feel off balance.  You keep throwing up (vomiting).  You feel more sleepy.  The black center of one eye (pupil) is bigger than the other one.  You twitch or shake violently (convulse) or have a seizure.  Your speech is not clear (is slurred).  You feel more tired, more confused, or more annoyed.  You do not recognize people or places.  You have neck pain.  It is hard to wake you up.  You have strange behavior changes.  You pass out (lose consciousness). Summary  A concussion is a brain injury from a direct hit (blow) to the head or body.  This condition is treated with rest and careful watching of symptoms.  If you keep having symptoms for more than 2 weeks, call your doctor. This information is not intended to replace advice given to you by your health care provider. Make sure you discuss any questions you have with your health care provider. Document Released: 12/25/2008 Document Revised: 12/22/2015 Document Reviewed: 12/22/2015 Elsevier Interactive Patient Education  2017 [employer].    IF you received an x-ray today, you will receive an invoice from [facility]. Please contact [facility] at [phone] with questions or concerns regarding your invoice.   IF you received labwork today, you will receive an invoice from [facility]. Please contact [facility] at [phone] with questions or concerns regarding your invoice.   Our billing staff will not be able to assist you with questions regarding bills from these companies.  You will be contacted with the lab results as soon as they are available. The fastest way to get your results is to activate your My Chart account. Instructions are located on the last page of this paperwork. If you have not heard from [place] regarding  the results in 2 weeks, please contact this office.
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
## 2016-06-02 NOTE — Assessment & Plan Note (Addendum)
Likely had a concussion based on mechanism as well SCAT5 symptom evaluation.  - provided work note for today  - provided work note stating to work half days this week - advised to follow up in one week. Can consider returning to full days as her symptoms allow.  - SCAT5 symptom sheet will be scanned in and also at the nurse's station

## 2016-06-04 ENCOUNTER — Encounter: Payer: Self-pay | Admitting: Gynecology

## 2016-06-09 ENCOUNTER — Encounter: Payer: Self-pay | Admitting: Family Medicine

## 2016-06-09 ENCOUNTER — Ambulatory Visit (INDEPENDENT_AMBULATORY_CARE_PROVIDER_SITE_OTHER): Payer: Managed Care, Other (non HMO) | Admitting: Family Medicine

## 2016-06-09 VITALS — BP 109/74 | HR 68 | Resp 16 | Ht 65.0 in | Wt 145.4 lb

## 2016-06-09 DIAGNOSIS — M545 Low back pain, unspecified: Secondary | ICD-10-CM

## 2016-06-09 DIAGNOSIS — M542 Cervicalgia: Secondary | ICD-10-CM

## 2016-06-09 DIAGNOSIS — S060X0D Concussion without loss of consciousness, subsequent encounter: Secondary | ICD-10-CM
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
## 2016-06-09 NOTE — Patient Instructions (Addendum)
Continue half work days for now until headaches improve further. Recheck in one week to determine if increased  time at work can be tolerated, return sooner if any worsening of headaches.  Tylenol as needed for neck or back pain, or meloxicam that was prescribed last week. Gentle range of motion and stretching as tolerated, recheck in 1 week.  Return to the clinic or go to the nearest emergency room if any of your symptoms worsen or new symptoms occur.   Back Pain, Adult Back pain is very common in adults.The cause of back pain is rarely dangerous and the pain often gets better over time.The cause of your back pain may not be known. Some common causes of back pain include:  Strain of the muscles or ligaments supporting the spine.  Wear and tear (degeneration) of the spinal disks.  Arthritis.  Direct injury to the back. For many people, back pain may return. Since back pain is rarely dangerous, most people can learn to manage this condition on their own. Follow these instructions at home: Watch your back pain for any changes. The following actions may help to lessen any discomfort you are feeling:  Remain active. It is stressful on your back to sit or stand in one place for long periods of time. Do not sit, drive, or stand in one place for more than 30 minutes at a time. Take short walks on even surfaces as soon as you are able.Try to increase the length of time you walk each day.  Exercise regularly as directed by your health care provider. Exercise helps your back heal faster. It also helps avoid future injury by keeping your muscles strong and flexible.  Do not stay in bed.Resting more than 1-2 days can delay your recovery.  Pay attention to your body when you bend and lift. The most comfortable positions are those that put less stress on your recovering back. Always use proper lifting techniques, including:  Bending your knees.  Keeping the load close to your body.  Avoiding  twisting.  Find a comfortable position to sleep. Use a firm mattress and lie on your side with your knees slightly bent. If you lie on your back, put a pillow under your knees.  Avoid feeling anxious or stressed.Stress increases muscle tension and can worsen back pain.It is important to recognize when you are anxious or stressed and learn ways to manage it, such as with exercise.  Take medicines only as directed by your health care provider. Over-the-counter medicines to reduce pain and inflammation are often the most helpful.Your health care provider may prescribe muscle relaxant drugs.These medicines help dull your pain so you can more quickly return to your normal activities and healthy exercise.  Apply ice to the injured area:  Put ice in a plastic bag.  Place a towel between your skin and the bag.  Leave the ice on for 20 minutes, 2-3 times a day for the first 2-3 days. After that, ice and heat may be alternated to reduce pain and spasms.  Maintain a healthy weight. Excess weight puts extra stress on your back and makes it difficult to maintain good posture. Contact a health care provider if:  You have pain that is not relieved with rest or medicine.  You have increasing pain going down into the legs or buttocks.  You have pain that does not improve in one week.  You have night pain.  You lose weight.  You have a fever or chills. Get help  right away if:  You develop new bowel or bladder control problems.  You have unusual weakness or numbness in your arms or legs.  You develop nausea or vomiting.  You develop abdominal pain.  You feel faint. This information is not intended to replace advice given to you by your health care provider. Make sure you discuss any questions you have with your health care provider. Document Released: 01/06/2005 Document Revised: 05/17/2015 Document Reviewed: 05/10/2013 Elsevier Interactive Patient Education  2017 [employer]vier  Inc[employer]  Concussion, Adult A concussion is a brain injury from a direct hit (blow) to the head or body. This blow causes the brain to shake quickly back and forth inside the skull. This can damage brain cells and cause chemical changes in the brain. A concussion may also be known as a mild traumatic brain injury (TBI). Concussions are usually not life-threatening, but the effects of a concussion can be serious. If you have a concussion, you are more likely to experience concussion-like symptoms after a direct blow to the head in the future. What are the causes? This condition is caused by:  A direct blow to the head, such as from running into another player during a game, being hit in a fight, or hitting your head on a hard surface.  A jolt of the head or neck that causes the brain to move back and forth inside the skull, such as in a car crash. What are the signs or symptoms? The signs of a concussion can be hard to notice. Early on, they may be missed by you, family members, and health care providers. You may look fine but act or feel differently. Symptoms are usually temporary, but they may last for days, weeks, or even longer. Some symptoms may appear right away but other symptoms may not show up for hours or days. Every head injury is different. Symptoms may include:  Headaches. This can include a feeling of pressure in the head.  Memory problems.  Trouble concentrating, organizing, or making decisions.  Slowness in thinking, acting or reacting, speaking, or reading.  Confusion.  Fatigue.  Changes in eating or sleeping patterns.  Problems with coordination or balance.  Nausea or vomiting.  Numbness or tingling.  Sensitivity to light or noise.  Vision or hearing problems.  Reduced sense of smell.  Irritability or mood changes.  Dizziness.  Lack of motivation.  Seeing or hearing things that other people do not see or hear (hallucinations). How is this diagnosed? This  condition is diagnosed based on:  Your symptoms.  A description of your injury. You may also have tests, including:  Imaging tests, such as a CT scan or MRI. These are done to look for signs of brain injury.  Neuropsychological tests. These measure your thinking, understanding, learning, and remembering abilities. How is this treated? This condition is treated with physical and mental rest and careful observation, usually at home. If the concussion is severe, you may need to stay home from work for a while. You may be referred to a concussion clinic or to other health care providers for management. It is important that you tell your health care provider if:  You are taking any medicines, including prescription medicines, over-the-counter medicines, and natural remedies. Some medicines, such as blood thinners (anticoagulants) and aspirin, may increase the chance of complications, such as bleeding.  You are taking or have taken alcohol or illegal drugs. Alcohol and certain other drugs may slow your recovery and can put you at risk  of further injury. How fast you will recover from a concussion depends on many factors, such as how severe your concussion is, what part of your brain was injured, how old you are, and how healthy you were before the concussion. Recovery can take time. It is important to wait to return to activity until a health care provider says it is safe to do that and your symptoms are completely gone. Follow these instructions at home: Activity   Limit activities that require a lot of thought or concentration. These may include:  Doing homework or job-related work.  Watching TV.  Working on the computer.  Playing memory games and puzzles.  Rest. Rest helps the brain to heal. Make sure you:  Get plenty of sleep at night. Avoid staying up late at night.  Keep the same bedtime hours on weekends and weekdays.  Rest during the day. Take naps or rest breaks when you feel  tired.  Having another concussion before the first one has healed can be dangerous. Do not do high-risk activities that could cause a second concussion, such as riding a bicycle or playing sports.  Ask your health care provider when you can return to your normal activities, such as school, work, athletics, driving, riding a bicycle, or using heavy machinery. Your ability to react may be slower after a brain injury. Never do these activities if you are dizzy. Your health care provider will likely give you a plan for gradually returning to activities. General instructions   Take over-the-counter and prescription medicines only as told by your health care provider.  Do not drink alcohol until your health care provider says you can.  If it is harder than usual to remember things, write them down.  If you are easily distracted, try to do one thing at a time. For example, do not try to watch TV while fixing dinner.  Talk with family members or close friends when making important decisions.  Watch your symptoms and tell others to do the same. Complications sometimes occur after a concussion. Older adults with a brain injury may have a higher risk of serious complications, such as a blood clot in the brain.  Tell your teachers, school nurse, school counselor, coach, athletic trainer, or work Production designer, theatre/television/film about your injury, symptoms, and restrictions. Tell them about what you can or cannot do. They should watch for:  Increased problems with attention or concentration.  Increased difficulty remembering or learning new information.  Increased time needed to complete tasks or assignments.  Increased irritability or decreased ability to cope with stress.  Increased symptoms.  Keep all follow-up visits as told by your health care provider. This is important. How is this prevented? It is very important to avoid another brain injury, especially as you recover. In rare cases, another injury can lead to  permanent brain damage, brain swelling, or death. The risk of this is greatest during the first 7-10 days after a head injury. Avoid injuries by:  Wearing a seat belt when riding in a car.  Wearing a helmet when biking, skiing, skateboarding, skating, or doing similar activities.  Avoiding activities that could lead to a second concussion, such as contact or recreational sports, until your health care provider says it is okay.  Taking safety measures in your home, such as:  Removing clutter and tripping hazards from floors and stairways.  Using grab bars in bathrooms and handrails by stairs.  Placing non-slip mats on floors and in bathtubs.  Improving lighting in  dim areas. Contact a health care provider if:  Your symptoms get worse.  You have new symptoms.  You continue to have symptoms for more than 2 weeks. Get help right away if:  You have severe or worsening headaches.  You have weakness or numbness in any part of your body.  Your coordination gets worse.  You vomit repeatedly.  You are sleepier.  The pupil of one eye is larger than the other.  You have convulsions or a seizure.  Your speech is slurred.  Your fatigue, confusion, or irritability gets worse.  You cannot recognize people or places.  You have neck pain.  It is difficult to wake you up.  You have unusual behavior changes.  You lose consciousness. Summary  A concussion is a brain injury from a direct hit (blow) to the head or body.  A concussion may also be called a mild traumatic brain injury (TBI).  You may have imaging tests and neuropsychological tests to diagnose a concussion.  This condition is treated with physical and mental rest and careful observation.  Ask your health care provider when you can return to your normal activities, such as school, work, athletics, driving, riding a bicycle, or using heavy machinery. Follow safety instructions as told by your health care  provider. This information is not intended to replace advice given to you by your health care provider. Make sure you discuss any questions you have with your health care provider. Document Released: 03/29/2003 Document Revised: 12/18/2015 Document Reviewed: 12/18/2015 Elsevier Interactive Patient Education  2017 [employer]   IF you received an x-ray today, you will receive an invoice from [facility]. Please contact [facility] at [phone] with questions or concerns regarding your invoice.   IF you received labwork today, you will receive an invoice from [facility]. Please contact [facility] at [phone] with questions or concerns regarding your invoice.   Our billing staff will not be able to assist you with questions regarding bills from these companies.  You will be contacted with the lab results as soon as they are available. The fastest way to get your results is to activate your My Chart account. Instructions are located on the last page of this paperwork. If you have not heard from us regarding the results in 2 weeks, please contact this office.

## 2016-06-17 ENCOUNTER — Ambulatory Visit: Payer: Managed Care, Other (non HMO) | Admitting: Family Medicine

## 2016-07-03 ENCOUNTER — Ambulatory Visit: Payer: Managed Care, Other (non HMO) | Admitting: Family Medicine

## 2016-09-04 ENCOUNTER — Telehealth: Payer: Self-pay | Admitting: *Deleted

## 2016-09-04 MED ORDER — GUAIFENESIN-CODEINE 100-10 MG/5ML PO SOLN: 5.0000 mL | Freq: Three times a day (TID) | ORAL | 0 refills | Status: DC | PRN

## 2016-09-04 NOTE — Telephone Encounter (Signed)
Rx will be printed and placed on Mary Hamilton desk to sign pt will come pickup

## 2016-09-04 NOTE — Telephone Encounter (Signed)
Pt aware you are out of the office) pt called asking for refill on Guaifenesin-codeine 100-10mg /45ml, take 5 mls 3 times daily as needed for cough. Prescribed in 12/2015. Pt said cough is bad, has tried OTC and no relief.  Please advise

## 2016-09-04 NOTE — Telephone Encounter (Signed)
Ok for refill.  PC office  If no relief, drink lots of fluids and rest

## 2016-12-09 ENCOUNTER — Telehealth: Payer: Self-pay | Admitting: *Deleted

## 2016-12-09 NOTE — Telephone Encounter (Signed)
Telephone call, reviewed office appointment as best, also need to do an STD screen, best to repeat hCGs and 48 hours, office appointment will be set for Monday, October 26. Switched to appointments

## 2016-12-09 NOTE — Telephone Encounter (Signed)
Pt called had positive UPT, LMP:11/05/16, took plan B pill 3 weeks ago, asked if Beta HCG can be drawn to confirm pregnancy, I explained we recommend OV.  Pt said she is having cramping, no bleeding yet. Aware plan b can delay cycle. I told pt I would run this information by you to get your recommendations. please advise

## 2016-12-15 ENCOUNTER — Encounter: Payer: Self-pay | Admitting: Women's Health

## 2016-12-15 ENCOUNTER — Ambulatory Visit (INDEPENDENT_AMBULATORY_CARE_PROVIDER_SITE_OTHER): Payer: 59 | Admitting: Women's Health

## 2016-12-15 VITALS — BP 110/74

## 2016-12-15 DIAGNOSIS — Z113 Encounter for screening for infections with a predominantly sexual mode of transmission: Secondary | ICD-10-CM | POA: Diagnosis not present

## 2016-12-15 DIAGNOSIS — Z30011 Encounter for initial prescription of contraceptive pills: Secondary | ICD-10-CM

## 2016-12-15 MED ORDER — LO LOESTRIN FE 1 MG-10 MCG / 10 MCG PO TABS: 1.0000 | ORAL_TABLET | Freq: Every day | ORAL | 3 refills | Status: DC

## 2016-12-15 NOTE — Progress Notes (Signed)
41 year old SHF G1 P1 with positive home UPT. LMP October 17, normal cycle. Did take Plan B emergency contraception after unprotected intercourse. Monthly cycle, condoms, new partner. Denies abdominal pain, urinary symptoms, vaginal discharge or fever. Denies any spotting. States has an appointment for probable termination tomorrow. No known health problems other than headaches on OCs. Has an 39 year old daughter in college doing well.  Exam: Tearful entire exam, abdomen soft, nontender, external genitalia within normal limits, speculum exam no visible discharge, GC/Chlamydia culture taken. Bimanual no CMT or adnexal tenderness, uterus small.  Unplanned Early pregnancy less than 6 weeks STD screen Contraception management  Plan: GC/Chlamydia culture pending, HIV, hep B, C, RPR. Options reviewed. Discussed contraception options for after termination. History of migraines, had an IUD in the past but did not like. Lo Loestrin prescription, proper use, slight risk for blood clots and strokes reviewed. Condoms encouraged until permanent partner.

## 2016-12-16 LAB — HIV ANTIBODY (ROUTINE TESTING W REFLEX): HIV 1&2 Ab, 4th Generation: NONREACTIVE

## 2016-12-16 LAB — RPR: RPR: NONREACTIVE

## 2016-12-16 LAB — C. TRACHOMATIS/N. GONORRHOEAE RNA
C. trachomatis RNA, TMA: NOT DETECTED
N. gonorrhoeae RNA, TMA: NOT DETECTED

## 2016-12-16 LAB — HEPATITIS C ANTIBODY
Hepatitis C Ab: NONREACTIVE
SIGNAL TO CUT-OFF: 0.02 (ref <1.00)

## 2016-12-16 LAB — HEPATITIS B SURFACE ANTIGEN: HEP B S AG: NONREACTIVE

## 2016-12-29 ENCOUNTER — Encounter: Payer: Managed Care, Other (non HMO) | Admitting: Women's Health

## 2017-01-05 ENCOUNTER — Encounter: Payer: Self-pay | Admitting: Women's Health

## 2017-01-05 ENCOUNTER — Ambulatory Visit (INDEPENDENT_AMBULATORY_CARE_PROVIDER_SITE_OTHER): Payer: 59 | Admitting: Women's Health

## 2017-01-05 VITALS — BP 118/78 | Ht 65.0 in | Wt 143.0 lb

## 2017-01-05 DIAGNOSIS — Z01419 Encounter for gynecological examination (general) (routine) without abnormal findings: Secondary | ICD-10-CM | POA: Diagnosis not present

## 2017-01-05 DIAGNOSIS — F419 Anxiety disorder, unspecified: Secondary | ICD-10-CM

## 2017-01-05 DIAGNOSIS — Z30015 Encounter for initial prescription of vaginal ring hormonal contraceptive: Secondary | ICD-10-CM

## 2017-01-05 DIAGNOSIS — Z13220 Encounter for screening for lipoid disorders: Secondary | ICD-10-CM

## 2017-01-05 LAB — CBC WITH DIFFERENTIAL/PLATELET
BASOS ABS: 40 {cells}/uL (ref 0–200)
Basophils Relative: 0.6 %
EOS PCT: 2.3 %
Eosinophils Absolute: 152 cells/uL (ref 15–500)
HEMATOCRIT: 39.3 % (ref 35.0–45.0)
HEMOGLOBIN: 13.1 g/dL (ref 11.7–15.5)
LYMPHS ABS: 1393 {cells}/uL (ref 850–3900)
MCH: 29.4 pg (ref 27.0–33.0)
MCHC: 33.3 g/dL (ref 32.0–36.0)
MCV: 88.3 fL (ref 80.0–100.0)
MPV: 11.2 fL (ref 7.5–12.5)
Monocytes Relative: 5.8 %
NEUTROS ABS: 4633 {cells}/uL (ref 1500–7800)
Neutrophils Relative %: 70.2 %
Platelets: 280 10*3/uL (ref 140–400)
RBC: 4.45 10*6/uL (ref 3.80–5.10)
RDW: 12.6 % (ref 11.0–15.0)
Total Lymphocyte: 21.1 %
WBC mixed population: 383 cells/uL (ref 200–950)
WBC: 6.6 10*3/uL (ref 3.8–10.8)

## 2017-01-05 LAB — URINALYSIS W MICROSCOPIC + REFLEX CULTURE
Bilirubin Urine: NEGATIVE
GLUCOSE, UA: NEGATIVE
Hgb urine dipstick: NEGATIVE
Leukocyte Esterase: NEGATIVE
Nitrites, Initial: NEGATIVE
PH: 5.5 (ref 5.0–8.0)
PROTEIN: NEGATIVE
RBC / HPF: NONE SEEN /HPF (ref 0–2)
Specific Gravity, Urine: 1.031 (ref 1.001–1.03)

## 2017-01-05 LAB — NO CULTURE INDICATED

## 2017-01-05 LAB — LIPID PANEL
Cholesterol: 170 mg/dL (ref <200)
HDL: 65 mg/dL (ref >50)
LDL Cholesterol (Calc): 89 mg/dL (calc)
NON-HDL CHOLESTEROL (CALC): 105 mg/dL (ref <130)
TRIGLYCERIDES: 72 mg/dL (ref <150)
Total CHOL/HDL Ratio: 2.6 (calc) (ref <5.0)

## 2017-01-05 LAB — GLUCOSE, RANDOM: Glucose, Bld: 85 mg/dL (ref 65–99)

## 2017-01-05 MED ORDER — ETONOGESTREL-ETHINYL ESTRADIOL 0.12-0.015 MG/24HR VA RING: VAGINAL_RING | VAGINAL | 4 refills | Status: DC

## 2017-01-05 MED ORDER — DIAZEPAM 5 MG PO TABS: 5.0000 mg | ORAL_TABLET | Freq: Four times a day (QID) | ORAL | 0 refills | Status: DC | PRN

## 2017-01-05 NOTE — Progress Notes (Signed)
Mary AntonJanie Hamilton Jan 12, 1976 [id number]    History:    Presents for annual exam. Termination last month, tearful when discussing, started on lo Loestrin. History of migraines without aura, and has had migraines with OCs in the past. Problems with IUD of dysmenorrhea, irregular bleeding. Negative STD screen 11/2016. Normal Pap history. History of regular monthly cycle.  Past medical history, past surgical history, family history and social history were all reviewed and documented in the EPIC chart. Daughter [id number] in college doing well.  ROS:  A ROS was performed and pertinent positives and negatives are included.  Exam:  Vitals:   01/05/17 0927  BP: 118/78  Weight: 143 lb (64.9 kg)  Height: 5\' 5"  (1.651 m)   Body mass index is 23.8 kg/m.   General appearance:  Normal Thyroid:  Symmetrical, normal in size, without palpable masses or nodularity. Respiratory  Auscultation:  Clear without wheezing or rhonchi Cardiovascular  Auscultation:  Regular rate, without rubs, murmurs or gallops  Edema/varicosities:  Not grossly evident Abdominal  Soft,nontender, without masses, guarding or rebound.  Liver/spleen:  No organomegaly noted  Hernia:  None appreciated  Skin  Inspection:  Grossly normal   Breasts: Examined lying and sitting.     Right: Without masses, retractions, discharge or axillary adenopathy.     Left: Without masses, retractions, discharge or axillary adenopathy. Gentitourinary   Inguinal/mons:  Normal without inguinal adenopathy  External genitalia:  Normal  BUS/Urethra/Skene's glands:  Normal  Vagina:  Normal  Cervix:  Normal  Uterus:  normal in size, shape and contour.  Midline and mobile  Adnexa/parametria:     Rt: Without masses or tenderness.   Lt: Without masses or tenderness.  Anus and perineum: Normal  Digital rectal exam: Normal sphincter tone without palpated masses or tenderness  Assessment/Plan:  41 y.o. SHF G2 P1 annual exam with no complaints.  Lo  Loestrin-recent start. Situational stress  Plan: Will try NuvaRing prescription, proper use, slight risk for blood clots and strokes reviewed. Instructed to call if continued problems with cost, lo Loestrin $50 per month with insurance  and coupon. Condoms encouraged until permanent partner. Valium 5 mg at bedtime when necessary, reviewed addictive properties, instructed to use sparingly, has used in the past when flying. SBE's, annual screening mammogram at 40. Continue regular exercise, healthy diet, MVI daily encouraged. CBC, glucose, lipid panel, Pap normal 2017, new screening guidelines reviewed.    Harrington Challengerancy J Taimane Stimmel [facility], 10:08 AM 01/05/2017

## 2017-01-05 NOTE — Patient Instructions (Signed)

## 2017-01-06 ENCOUNTER — Encounter: Payer: Self-pay | Admitting: Women's Health

## 2017-02-09 ENCOUNTER — Telehealth: Payer: Self-pay | Admitting: *Deleted

## 2017-02-09 MED ORDER — RIZATRIPTAN BENZOATE 5 MG PO TABS: 5.0000 mg | ORAL_TABLET | ORAL | 0 refills | Status: DC | PRN

## 2017-02-09 NOTE — Telephone Encounter (Signed)
Rx sent 

## 2017-02-09 NOTE — Telephone Encounter (Signed)
Okay for refill, Maxalt 5 mg p.m. 2 hours if needed no more than 2 tablets in one day.

## 2017-02-09 NOTE — Telephone Encounter (Signed)
Pt called c/o migraine headache, states only 1 pill left. Has done well last couple of years without medication. Maxalt 5 mg was last filled in 2016, asked if you would be willing to refill again? Please advise

## 2017-02-12 ENCOUNTER — Other Ambulatory Visit: Payer: Self-pay | Admitting: Family Medicine

## 2017-02-12 DIAGNOSIS — E049 Nontoxic goiter, unspecified: Secondary | ICD-10-CM

## 2017-02-17 ENCOUNTER — Ambulatory Visit
Admission: RE | Admit: 2017-02-17 | Discharge: 2017-02-17 | Disposition: A | Discharge status: Home / Self Care | Payer: 59 | Source: Ambulatory Visit | Attending: Family Medicine | Admitting: Family Medicine

## 2017-02-17 DIAGNOSIS — E049 Nontoxic goiter, unspecified: Secondary | ICD-10-CM

## 2017-02-23 ENCOUNTER — Other Ambulatory Visit: Payer: Self-pay | Admitting: Women's Health

## 2017-02-23 ENCOUNTER — Telehealth: Payer: Self-pay

## 2017-02-23 NOTE — Telephone Encounter (Signed)
I called patient because we received a [facility] today concerned about appropriate drug therapy for this patient. Evidently, per [place], at her last visit the plan was the patient was d/c'ing the OC's due to cost and would start the Nuvaring .  However, the patient said she continued on the OC's as she prefers that regardless of cost and she picked up the Nuvaring because the pharmacy called her and told her it was ready and it was no cost to her.  She has two Nuvaring now she said.  I told her this was not appropriate to get both Rx's especially if not using the Nuvaring. I told her I would cancel the Nuvaring with the pharmacy and if she decided to start on it at some point to call and we will send Rx for Nuvaring and cancel OC rx.

## 2017-06-11 ENCOUNTER — Encounter: Payer: Self-pay | Admitting: Family Medicine

## 2017-12-01 ENCOUNTER — Other Ambulatory Visit: Payer: Self-pay | Admitting: Women's Health

## 2017-12-01 DIAGNOSIS — Z30011 Encounter for initial prescription of contraceptive pills: Secondary | ICD-10-CM

## 2018-01-06 ENCOUNTER — Ambulatory Visit (INDEPENDENT_AMBULATORY_CARE_PROVIDER_SITE_OTHER): Payer: 59 | Admitting: Women's Health

## 2018-01-06 ENCOUNTER — Encounter: Payer: Self-pay | Admitting: Women's Health

## 2018-01-06 VITALS — BP 120/70 | Ht 65.0 in | Wt 138.0 lb

## 2018-01-06 DIAGNOSIS — Z113 Encounter for screening for infections with a predominantly sexual mode of transmission: Secondary | ICD-10-CM | POA: Diagnosis not present

## 2018-01-06 DIAGNOSIS — F419 Anxiety disorder, unspecified: Secondary | ICD-10-CM | POA: Diagnosis not present

## 2018-01-06 DIAGNOSIS — Z13220 Encounter for screening for lipoid disorders: Secondary | ICD-10-CM

## 2018-01-06 DIAGNOSIS — Z30011 Encounter for initial prescription of contraceptive pills: Secondary | ICD-10-CM | POA: Diagnosis not present

## 2018-01-06 DIAGNOSIS — Z01419 Encounter for gynecological examination (general) (routine) without abnormal findings: Secondary | ICD-10-CM | POA: Diagnosis not present

## 2018-01-06 MED ORDER — ESCITALOPRAM OXALATE 10 MG PO TABS: 10.0000 mg | ORAL_TABLET | Freq: Every day | ORAL | 4 refills | Status: DC

## 2018-01-06 MED ORDER — RIZATRIPTAN BENZOATE 5 MG PO TABS: 5.0000 mg | ORAL_TABLET | ORAL | 12 refills | Status: DC | PRN

## 2018-01-06 MED ORDER — DIAZEPAM 5 MG PO TABS: 5.0000 mg | ORAL_TABLET | Freq: Four times a day (QID) | ORAL | 0 refills | Status: DC | PRN

## 2018-01-06 MED ORDER — LO LOESTRIN FE 1 MG-10 MCG / 10 MCG PO TABS: 1.0000 | ORAL_TABLET | Freq: Every day | ORAL | 4 refills | Status: DC

## 2018-01-06 NOTE — Patient Instructions (Signed)
[facility]  [phone] Health Maintenance, Female Adopting a healthy lifestyle and getting preventive care can go a long way to promote health and wellness. Talk with your health care provider about what schedule of regular examinations is right for you. This is a good chance for you to check in with your provider about disease prevention and staying healthy. In between checkups, there are plenty of things you can do on your own. Experts have done a lot of research about which lifestyle changes and preventive measures are most likely to keep you healthy. Ask your health care provider for more information. Weight and diet Eat a healthy diet  Be sure to include plenty of vegetables, fruits, low-fat dairy products, and lean protein.  Do not eat a lot of foods high in solid fats, added sugars, or salt.  Get regular exercise. This is one of the most important things you can do for your health. ? Most adults should exercise for at least 150 minutes each week. The exercise should increase your heart rate and make you sweat (moderate-intensity exercise). ? Most adults should also do strengthening exercises at least twice a week. This is in addition to the moderate-intensity exercise. Maintain a healthy weight  Body mass index (BMI) is a measurement that can be used to identify possible weight problems. It estimates body fat based on height and weight. Your health care provider can help determine your BMI and help you achieve or maintain a healthy weight.  For females 37 years of age and older: ? A BMI below 18.5 is considered underweight. ? A BMI of 18.5 to 24.9 is normal. ? A BMI of 25 to 29.9 is considered overweight. ? A BMI of 30 and above is considered obese. Watch levels of cholesterol and blood lipids  You should start having your blood tested for lipids and cholesterol at 40 years of age, then have this test every 5 years.  You may need to have your cholesterol levels checked more often  if: ? Your lipid or cholesterol levels are high. ? You are older than 40 years of age. ? You are at high risk for heart disease. Cancer screening Lung Cancer  Lung cancer screening is recommended for adults 39-44 years old who are at high risk for lung cancer because of a history of smoking.  A yearly low-dose CT scan of the lungs is recommended for people who: ? Currently smoke. ? Have quit within the past 15 years. ? Have at least a 30-pack-year history of smoking. A pack year is smoking an average of one pack of cigarettes a day for 1 year.  Yearly screening should continue until it has been 15 years since you quit.  Yearly screening should stop if you develop a health problem that would prevent you from having lung cancer treatment. Breast Cancer  Practice breast self-awareness. This means understanding how your breasts normally appear and feel.  It also means doing regular breast self-exams. Let your health care provider know about any changes, no matter how small.  If you are in your 20s or 30s, you should have a clinical breast exam (CBE) by a health care provider every 1-3 years as part of a regular health exam.  If you are 105 or older, have a CBE every year. Also consider having a breast X-ray (mammogram) every year.  If you have a family history of breast cancer, talk to your health care provider about genetic screening.  If you are at high risk  for breast cancer, talk to your health care provider about having an MRI and a mammogram every year.  Breast cancer gene (BRCA) assessment is recommended for women who have family members with BRCA-related cancers. BRCA-related cancers include: ? Breast. ? Ovarian. ? Tubal. ? Peritoneal cancers.  Results of the assessment will determine the need for genetic counseling and BRCA1 and BRCA2 testing. Cervical Cancer Your health care provider may recommend that you be screened regularly for cancer of the pelvic organs (ovaries,  uterus, and vagina). This screening involves a pelvic examination, including checking for microscopic changes to the surface of your cervix (Pap test). You may be encouraged to have this screening done every 3 years, beginning at age 82.  For women ages 56-65, health care providers may recommend pelvic exams and Pap testing every 3 years, or they may recommend the Pap and pelvic exam, combined with testing for human papilloma virus (HPV), every 5 years. Some types of HPV increase your risk of cervical cancer. Testing for HPV may also be done on women of any age with unclear Pap test results.  Other health care providers may not recommend any screening for nonpregnant women who are considered low risk for pelvic cancer and who do not have symptoms. Ask your health care provider if a screening pelvic exam is right for you.  If you have had past treatment for cervical cancer or a condition that could lead to cancer, you need Pap tests and screening for cancer for at least 20 years after your treatment. If Pap tests have been discontinued, your risk factors (such as having a new sexual partner) need to be reassessed to determine if screening should resume. Some women have medical problems that increase the chance of getting cervical cancer. In these cases, your health care provider may recommend more frequent screening and Pap tests. Colorectal Cancer  This type of cancer can be detected and often prevented.  Routine colorectal cancer screening usually begins at 40 years of age and continues through 40 years of age.  Your health care provider may recommend screening at an earlier age if you have risk factors for colon cancer.  Your health care provider may also recommend using home test kits to check for hidden blood in the stool.  A small camera at the end of a tube can be used to examine your colon directly (sigmoidoscopy or colonoscopy). This is done to check for the earliest forms of colorectal  cancer.  Routine screening usually begins at age 79.  Direct examination of the colon should be repeated every 5-10 years through 40 years of age. However, you may need to be screened more often if early forms of precancerous polyps or small growths are found. Skin Cancer  Check your skin from head to toe regularly.  Tell your health care provider about any new moles or changes in moles, especially if there is a change in a mole's shape or color.  Also tell your health care provider if you have a mole that is larger than the size of a pencil eraser.  Always use sunscreen. Apply sunscreen liberally and repeatedly throughout the day.  Protect yourself by wearing long sleeves, pants, a wide-brimmed hat, and sunglasses whenever you are outside. Heart disease, diabetes, and high blood pressure  High blood pressure causes heart disease and increases the risk of stroke. High blood pressure is more likely to develop in: ? People who have blood pressure in the high end of the normal range (130-139/85-89  mm Hg). ? People who are overweight or obese. ? People who are African American.  If you are 13-23 years of age, have your blood pressure checked every 3-5 years. If you are 53 years of age or older, have your blood pressure checked every year. You should have your blood pressure measured twice-once when you are at a hospital or clinic, and once when you are not at a hospital or clinic. Record the average of the two measurements. To check your blood pressure when you are not at a hospital or clinic, you can use: ? An automated blood pressure machine at a pharmacy. ? A home blood pressure monitor.  If you are between 45 years and 72 years old, ask your health care provider if you should take aspirin to prevent strokes.  Have regular diabetes screenings. This involves taking a blood sample to check your fasting blood sugar level. ? If you are at a normal weight and have a low risk for diabetes,  have this test once every three years after 40 years of age. ? If you are overweight and have a high risk for diabetes, consider being tested at a younger age or more often. Preventing infection Hepatitis B  If you have a higher risk for hepatitis B, you should be screened for this virus. You are considered at high risk for hepatitis B if: ? You were born in a country where hepatitis B is common. Ask your health care provider which countries are considered high risk. ? Your parents were born in a high-risk country, and you have not been immunized against hepatitis B (hepatitis B vaccine). ? You have HIV or AIDS. ? You use needles to inject street drugs. ? You live with someone who has hepatitis B. ? You have had sex with someone who has hepatitis B. ? You get hemodialysis treatment. ? You take certain medicines for conditions, including cancer, organ transplantation, and autoimmune conditions. Hepatitis C  Blood testing is recommended for: ? Everyone born from [birth year] through 1963. ? Anyone with known risk factors for hepatitis C. Sexually transmitted infections (STIs)  You should be screened for sexually transmitted infections (STIs) including gonorrhea and chlamydia if: ? You are sexually active and are younger than 40 years of age. ? You are older than 40 years of age and your health care provider tells you that you are at risk for this type of infection. ? Your sexual activity has changed since you were last screened and you are at an increased risk for chlamydia or gonorrhea. Ask your health care provider if you are at risk.  If you do not have HIV, but are at risk, it may be recommended that you take a prescription medicine daily to prevent HIV infection. This is called pre-exposure prophylaxis (PrEP). You are considered at risk if: ? You are sexually active and do not regularly use condoms or know the HIV status of your partner(s). ? You take drugs by injection. ? You are sexually  active with a partner who has HIV. Talk with your health care provider about whether you are at high risk of being infected with HIV. If you choose to begin PrEP, you should first be tested for HIV. You should then be tested every 3 months for as long as you are taking PrEP. Pregnancy  If you are premenopausal and you may become pregnant, ask your health care provider about preconception counseling.  If you may become pregnant, take 400 to 800 micrograms (mcg)  of folic acid every day.  If you want to prevent pregnancy, talk to your health care provider about birth control (contraception). Osteoporosis and menopause  Osteoporosis is a disease in which the bones lose minerals and strength with aging. This can result in serious bone fractures. Your risk for osteoporosis can be identified using a bone density scan.  If you are 79 years of age or older, or if you are at risk for osteoporosis and fractures, ask your health care provider if you should be screened.  Ask your health care provider whether you should take a calcium or vitamin D supplement to lower your risk for osteoporosis.  Menopause may have certain physical symptoms and risks.  Hormone replacement therapy may reduce some of these symptoms and risks. Talk to your health care provider about whether hormone replacement therapy is right for you. Follow these instructions at home:  Schedule regular health, dental, and eye exams.  Stay current with your immunizations.  Do not use any tobacco products including cigarettes, chewing tobacco, or electronic cigarettes.  If you are pregnant, do not drink alcohol.  If you are breastfeeding, limit how much and how often you drink alcohol.  Limit alcohol intake to no more than 1 drink per day for nonpregnant women. One drink equals 12 ounces of beer, 5 ounces of wine, or 1 ounces of hard liquor.  Do not use street drugs.  Do not share needles.  Ask your health care provider for  help if you need support or information about quitting drugs.  Tell your health care provider if you often feel depressed.  Tell your health care provider if you have ever been abused or do not feel safe at home. This information is not intended to replace advice given to you by your health care provider. Make sure you discuss any questions you have with your health care provider. Document Released: 07/22/2010 Document Revised: 06/14/2015 Document Reviewed: 10/10/2014 Elsevier Interactive Patient Education  2019 [employer].

## 2018-01-06 NOTE — Progress Notes (Signed)
Mary AntonJanie Hamilton 02-27-1975 [id number]    History:    Presents for annual exam.  Amenorrheic on lo Loestrin without complaint.  Not sexually active currently.  Migraines without aura Maxalt rare.  Normal Pap history.  Struggling with depression, has had in the past, no feelings of self-harm.  Past medical history, past surgical history, family history and social history were all reviewed and documented in the EPIC chart.  Job okay.  Daughter 2219 doing well working and going to school.  ROS:  A ROS was performed and pertinent positives and negatives are included.  Exam:  Vitals:   01/06/18 0939  BP: 120/70  Weight: 138 lb (62.6 kg)  Height: 5\' 5"  (1.651 m)   Body mass index is 22.96 kg/m.   General appearance:  Normal Thyroid:  Symmetrical, normal in size, without palpable masses or nodularity. Respiratory  Auscultation:  Clear without wheezing or rhonchi Cardiovascular  Auscultation:  Regular rate, without rubs, murmurs or gallops  Edema/varicosities:  Not grossly evident Abdominal  Soft,nontender, without masses, guarding or rebound.  Liver/spleen:  No organomegaly noted  Hernia:  None appreciated  Skin  Inspection:  Grossly normal   Breasts: Examined lying and sitting.     Right: Without masses, retractions, discharge or axillary adenopathy.     Left: Without masses, retractions, discharge or axillary adenopathy. Gentitourinary   Inguinal/mons:  Normal without inguinal adenopathy  External genitalia:  Normal  BUS/Urethra/Skene's glands:  Normal  Vagina:  Normal  Cervix:  Normal  Uterus:   normal in size, shape and contour.  Midline and mobile  Adnexa/parametria:     Rt: Without masses or tenderness.   Lt: Without masses or tenderness.  Anus and perineum: Normal  Digital rectal exam: Normal sphincter tone without palpated masses or tenderness  Assessment/Plan:  42 y.o. SHF G2, P1 for annual exam.    Amenorrheic on lo Loestrin Migraines without aura / decreasing  frequency Depression with anxiety STD screen  Plan: Lo Loestrin prescription, proper use, slight risk for blood clots and strokes reviewed.  Condoms encouraged until permanent partner.  SBEs, reviewed importance of annual screening mammogram starting now at 40, breast center information given instructed to schedule.  Exercise, calcium rich foods, self-care, leisure activities encouraged.  Options reviewed, will try Lexapro 10 mg half tablet weekly for 1 week and then full tablet.  Counseling strongly encouraged, counseling information given.  Valium 5 mg uses for air travel.  Maxalt 5 mg prescription, proper use given and reviewed.  CBC, CMP, lipid panel, GC/chlamydia, HIV, hep B, C, RPR.  Pap normal 2017, new screening guidelines reviewed.    Harrington Challengerancy J Young [facility]ene Endoscopy CenterWHNP, 12:00 PM 01/06/2018

## 2018-01-07 LAB — CBC WITH DIFFERENTIAL/PLATELET
Absolute Monocytes: 342 cells/uL (ref 200–950)
BASOS ABS: 29 {cells}/uL (ref 0–200)
Basophils Relative: 0.5 %
Eosinophils Absolute: 110 cells/uL (ref 15–500)
Eosinophils Relative: 1.9 %
HCT: 47 % — ABNORMAL HIGH (ref 35.0–45.0)
Hemoglobin: 16.1 g/dL — ABNORMAL HIGH (ref 11.7–15.5)
Lymphs Abs: 1531 cells/uL (ref 850–3900)
MCH: 30.9 pg (ref 27.0–33.0)
MCHC: 34.3 g/dL (ref 32.0–36.0)
MCV: 90.2 fL (ref 80.0–100.0)
MPV: 10.8 fL (ref 7.5–12.5)
Monocytes Relative: 5.9 %
NEUTROS ABS: 3787 {cells}/uL (ref 1500–7800)
Neutrophils Relative %: 65.3 %
Platelets: 322 10*3/uL (ref 140–400)
RBC: 5.21 10*6/uL — ABNORMAL HIGH (ref 3.80–5.10)
RDW: 11.8 % (ref 11.0–15.0)
Total Lymphocyte: 26.4 %
WBC: 5.8 10*3/uL (ref 3.8–10.8)

## 2018-01-07 LAB — COMPREHENSIVE METABOLIC PANEL
AG Ratio: 1.4 (calc) (ref 1.0–2.5)
ALT: 14 U/L (ref 6–29)
AST: 21 U/L (ref 10–30)
Albumin: 4.5 g/dL (ref 3.6–5.1)
Alkaline phosphatase (APISO): 43 U/L (ref 33–115)
BUN: 12 mg/dL (ref 7–25)
CO2: 25 mmol/L (ref 20–32)
Calcium: 9.5 mg/dL (ref 8.6–10.2)
Chloride: 102 mmol/L (ref 98–110)
Creat: 0.98 mg/dL (ref 0.50–1.10)
Globulin: 3.2 g/dL (calc) (ref 1.9–3.7)
Glucose, Bld: 88 mg/dL (ref 65–99)
Potassium: 3.9 mmol/L (ref 3.5–5.3)
SODIUM: 137 mmol/L (ref 135–146)
Total Bilirubin: 0.7 mg/dL (ref 0.2–1.2)
Total Protein: 7.7 g/dL (ref 6.1–8.1)

## 2018-01-07 LAB — LIPID PANEL
Cholesterol: 216 mg/dL — ABNORMAL HIGH (ref <200)
HDL: 75 mg/dL (ref >50)
LDL Cholesterol (Calc): 122 mg/dL (calc) — ABNORMAL HIGH
Non-HDL Cholesterol (Calc): 141 mg/dL (calc) — ABNORMAL HIGH (ref <130)
Total CHOL/HDL Ratio: 2.9 (calc) (ref <5.0)
Triglycerides: 89 mg/dL (ref <150)

## 2018-01-07 LAB — C. TRACHOMATIS/N. GONORRHOEAE RNA
C. trachomatis RNA, TMA: NOT DETECTED
N. gonorrhoeae RNA, TMA: NOT DETECTED

## 2018-01-07 LAB — HEPATITIS C ANTIBODY
Hepatitis C Ab: NONREACTIVE
SIGNAL TO CUT-OFF: 0.03 (ref <1.00)

## 2018-01-07 LAB — HIV ANTIBODY (ROUTINE TESTING W REFLEX): HIV 1&2 Ab, 4th Generation: NONREACTIVE

## 2018-01-07 LAB — HEPATITIS B SURFACE ANTIGEN: HEP B S AG: NONREACTIVE

## 2018-01-07 LAB — RPR: RPR Ser Ql: NONREACTIVE

## 2018-10-11 ENCOUNTER — Other Ambulatory Visit: Payer: Self-pay

## 2018-10-11 ENCOUNTER — Ambulatory Visit (INDEPENDENT_AMBULATORY_CARE_PROVIDER_SITE_OTHER): Payer: 59 | Admitting: Women's Health

## 2018-10-11 ENCOUNTER — Encounter: Payer: Self-pay | Admitting: Women's Health

## 2018-10-11 VITALS — BP 118/74

## 2018-10-11 DIAGNOSIS — Z113 Encounter for screening for infections with a predominantly sexual mode of transmission: Secondary | ICD-10-CM

## 2018-10-11 DIAGNOSIS — N898 Other specified noninflammatory disorders of vagina: Secondary | ICD-10-CM

## 2018-10-11 DIAGNOSIS — R300 Dysuria: Secondary | ICD-10-CM

## 2018-10-11 LAB — WET PREP FOR TRICH, YEAST, CLUE

## 2018-10-11 MED ORDER — FLUCONAZOLE 150 MG PO TABS: 150.0000 mg | ORAL_TABLET | Freq: Once | ORAL | 1 refills | Status: AC

## 2018-10-11 MED ORDER — SULFAMETHOXAZOLE-TRIMETHOPRIM 800-160 MG PO TABS: 1.0000 | ORAL_TABLET | Freq: Two times a day (BID) | ORAL | 0 refills | Status: DC

## 2018-10-11 NOTE — Progress Notes (Signed)
43 year old SHF G2, P1 presents with complaint of urinary symptoms of increased urgency, frequency, pain and burning especially at end of stream of urination that intensified yesterday.  Was [facility] at [facility] for [facility]TI symptoms last week was prescribed Macrobid twice daily for 5 days had some relief but symptoms are now worse.  Having increased vaginal itching with irritation, denies odor, abdominal pain or fever.  Amenorrheic on lo Loestrin.  New partner in the past year.  Tearful, has had problems with depression in the past, denies feelings of self-harm.  Medical history includes migraines.  Exam: Tearful, no CVAT.  Abdomen soft, slight tenderness at suprapubic area, external genitalia erythemic at introitus, speculum exam scant white discharge vaginal wall slightly erythematous, wet prep positive for yeast, GC/chlamydia culture taken.  Bimanual no CMT or adnexal tenderness. UA: Biochemical is not reported due to color interference orange has taken Azo.  Greater than 60 WBCs, greater than 60 RBCs, 10-20 squamous epithelials, many bacteria  UTI Yeast vaginitis Depression STD screen  Plan: Septra twice daily for 5 days, #10, Diflucan 150 p.o. today with refill.  UTI and yeast prevention discussed.  Instructed to call if continued symptoms.  Can continue over-the-counter Azo as needed.  GC/Chlamydia culture pending, HIV, RPR.  Strongly encouraged counseling, [facility] counseling services phone number given instructed to schedule, reviewed may need medication as well as counseling.

## 2018-10-11 NOTE — Patient Instructions (Signed)
Mary Hamilton   [phone] Mary Hamilton  [id number]   Urinary Tract Infection, Adult A urinary tract infection (UTI) is an infection of any part of the urinary tract. The urinary tract includes:  The kidneys.  The ureters.  The bladder.  The urethra. These organs make, store, and get rid of pee (urine) in the body. What are the causes? This is caused by germs (bacteria) in your genital area. These germs grow and cause swelling (inflammation) of your urinary tract. What increases the risk? You are more likely to develop this condition if:  You have a small, thin tube (catheter) to drain pee.  You cannot control when you pee or poop (incontinence).  You are female, and: ? You use these methods to prevent pregnancy: ? A medicine that kills sperm (spermicide). ? A device that blocks sperm (diaphragm). ? You have low levels of a female hormone (estrogen). ? You are pregnant.  You have genes that add to your risk.  You are sexually active.  You take antibiotic medicines.  You have trouble peeing because of: ? A prostate that is bigger than normal, if you are female. ? A blockage in the part of your body that drains pee from the bladder (urethra). ? A kidney stone. ? A nerve condition that affects your bladder (neurogenic bladder). ? Not getting enough to drink. ? Not peeing often enough.  You have other conditions, such as: ? Diabetes. ? A weak disease-fighting system (immune system). ? Sickle cell disease. ? Gout. ? Injury of the spine. What are the signs or symptoms? Symptoms of this condition include:  Needing to pee right away (urgently).  Peeing often.  Peeing small amounts often.  Pain or burning when peeing.  Blood in the pee.  Pee that smells bad or not like normal.  Trouble peeing.  Pee that is cloudy.  Fluid coming from the vagina, if you are female.  Pain in the belly or lower back. Other symptoms include:  Throwing up (vomiting).   No urge to eat.  Feeling mixed up (confused).  Being tired and grouchy (irritable).  A fever.  Watery poop (diarrhea). How is this treated? This condition may be treated with:  Antibiotic medicine.  Other medicines.  Drinking enough water. Follow these instructions at home:  Medicines  Take over-the-counter and prescription medicines only as told by your doctor.  If you were prescribed an antibiotic medicine, take it as told by your doctor. Do not stop taking it even if you start to feel better. General instructions  Make sure you: ? Pee until your bladder is empty. ? Do not hold pee for a long time. ? Empty your bladder after sex. ? Wipe from front to back after pooping if you are a female. Use each tissue one time when you wipe.  Drink enough fluid to keep your pee pale yellow.  Keep all follow-up visits as told by your doctor. This is important. Contact a doctor if:  You do not get better after 1-2 days.  Your symptoms go away and then come back. Get help right away if:  You have very bad back pain.  You have very bad pain in your lower belly.  You have a fever.  You are sick to your stomach (nauseous).  You are throwing up. Summary  A urinary tract infection (UTI) is an infection of any part of the urinary tract.  This condition is caused by germs in your genital area.  There  are many risk factors for a UTI. These include having a small, thin tube to drain pee and not being able to control when you pee or poop.  Treatment includes antibiotic medicines for germs.  Drink enough fluid to keep your pee pale yellow. This information is not intended to replace advice given to you by your health care provider. Make sure you discuss any questions you have with your health care provider. Document Released: 06/25/2007 Document Revised: 12/24/2017 Document Reviewed: 07/16/2017 Elsevier Patient Education  2020 [employer].

## 2018-10-12 LAB — C. TRACHOMATIS/N. GONORRHOEAE RNA
C. trachomatis RNA, TMA: NOT DETECTED
N. gonorrhoeae RNA, TMA: NOT DETECTED

## 2018-10-13 LAB — URINE CULTURE
MICRO NUMBER:: 908188
SPECIMEN QUALITY:: ADEQUATE

## 2018-10-13 LAB — URINALYSIS, COMPLETE W/RFL CULTURE
Hyaline Cast: NONE SEEN /LPF
RBC / HPF: >=60 /HPF — AB (ref 0–2)
WBC, UA: >=60 /HPF — AB (ref 0–5)

## 2018-10-13 LAB — CULTURE INDICATED

## 2018-10-26 ENCOUNTER — Ambulatory Visit: Payer: 59 | Admitting: Psychology

## 2019-01-12 ENCOUNTER — Ambulatory Visit (INDEPENDENT_AMBULATORY_CARE_PROVIDER_SITE_OTHER): Payer: Managed Care, Other (non HMO) | Admitting: Women's Health

## 2019-01-12 ENCOUNTER — Encounter: Payer: Self-pay | Admitting: Women's Health

## 2019-01-12 ENCOUNTER — Other Ambulatory Visit: Payer: Self-pay

## 2019-01-12 VITALS — BP 118/80 | Ht 65.0 in | Wt 144.0 lb

## 2019-01-12 DIAGNOSIS — Z13220 Encounter for screening for lipoid disorders: Secondary | ICD-10-CM

## 2019-01-12 DIAGNOSIS — F419 Anxiety disorder, unspecified: Secondary | ICD-10-CM

## 2019-01-12 DIAGNOSIS — Z01419 Encounter for gynecological examination (general) (routine) without abnormal findings: Secondary | ICD-10-CM | POA: Diagnosis not present

## 2019-01-12 MED ORDER — DIAZEPAM 5 MG PO TABS: 5.0000 mg | ORAL_TABLET | Freq: Four times a day (QID) | ORAL | 0 refills | Status: DC | PRN

## 2019-01-12 MED ORDER — RIZATRIPTAN BENZOATE 5 MG PO TABS: 5.0000 mg | ORAL_TABLET | ORAL | 12 refills | Status: DC | PRN

## 2019-01-12 NOTE — Progress Notes (Signed)
Mary Hamilton [birth year] [id number]    History:    Presents for annual exam.  Taking lo Loestrin continuously but does have cramping often.  Good relief of dysmenorrhea with cycles, less frequent headaches.  Normal Pap history has not had a screening mammogram.  Not sexually active denies need for STD screen.  History of headaches, anxiety/depression.  Had a termination 2 years ago and is still struggling with choice.  Past medical history, past surgical history, family history and social history were all reviewed and documented in the EPIC chart.  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  ROS:  A ROS was performed and pertinent positives and negatives are included.  Exam:  Vitals:   01/12/19 0904  BP: 118/80  Weight: 144 lb (65.3 kg)  Height: 5\' 5"  (1.651 m)   Body mass index is 23.96 kg/m.   General appearance:  Normal Thyroid:  Symmetrical, normal in size, without palpable masses or nodularity. Respiratory  Auscultation:  Clear without wheezing or rhonchi Cardiovascular  Auscultation:  Regular rate, without rubs, murmurs or gallops  Edema/varicosities:  Not grossly evident Abdominal  Soft,nontender, without masses, guarding or rebound.  Liver/spleen:  No organomegaly noted  Hernia:  None appreciated  Skin  Inspection:  Grossly normal   Breasts: Examined lying and sitting.     Right: Without masses, retractions, discharge or axillary adenopathy.     Left: Without masses, retractions, discharge or axillary adenopathy. Gentitourinary   Inguinal/mons:  Normal without inguinal adenopathy  External genitalia:  Normal  BUS/Urethra/Skene's glands:  Normal  Vagina:  Normal  Cervix:  Normal  Uterus:   normal in size, shape and contour.  Midline and mobile  Adnexa/parametria:     Rt: Without masses or tenderness.   Lt: Without masses or tenderness.  Anus and perineum: Normal  Digital rectal exam: Normal sphincter tone without palpated masses or  tenderness  Assessment/Plan:  43 y.o. SWF G1, P1 for annual exam with complaint of menstrual-like cramping many days of the month.  Amenorrheic on lo Loestrin continuously Migraines without aura Depression  Plan: Tearful most of office visit strongly encourage counseling, [facility] counseling number given instructed to schedule.  Reviewed may need medication.  Reviewed importance of self-care, leisure activities.  SBEs, breast center information given instructed to schedule mammogram reviewed importance of annual screening.  Exercise, calcium rich foods, vitamin D 1000 daily encouraged.  Refill of lo Loestrin given, instructed to stop every other month for 4 days and have a cycle to see if that may decrease cramping. ( Denies any GI changes or problems.)  Instructed to call if continued problems with cramping sensation.  Refill of Maxalt 5 mg p.o. prescription given instructions.  Refill of Valium 5 mg with no refills does use sparingly is aware it is addictive.  CBC, CMP, lipid panel, Pap with HR HPV typing.  New screening guidelines reviewed.   [place], 10:18 AM 01/12/2019

## 2019-01-12 NOTE — Patient Instructions (Addendum)
[facility] [phone] Good to see you today VIt d 1000 daily lebaurer counseling  Mary Hamilton  [phone]  Health Maintenance, Female Adopting a healthy lifestyle and getting preventive care are important in promoting health and wellness. Ask your health care provider about:  The right schedule for you to have regular tests and exams.  Things you can do on your own to prevent diseases and keep yourself healthy. What should I know about diet, weight, and exercise? Eat a healthy diet   Eat a diet that includes plenty of vegetables, fruits, low-fat dairy products, and lean protein.  Do not eat a lot of foods that are high in solid fats, added sugars, or sodium. Maintain a healthy weight Body mass index (BMI) is used to identify weight problems. It estimates body fat based on height and weight. Your health care provider can help determine your BMI and help you achieve or maintain a healthy weight. Get regular exercise Get regular exercise. This is one of the most important things you can do for your health. Most adults should:  Exercise for at least 150 minutes each week. The exercise should increase your heart rate and make you sweat (moderate-intensity exercise).  Do strengthening exercises at least twice a week. This is in addition to the moderate-intensity exercise.  Spend less time sitting. Even light physical activity can be beneficial. Watch cholesterol and blood lipids Have your blood tested for lipids and cholesterol at 41 years of age, then have this test every 5 years. Have your cholesterol levels checked more often if:  Your lipid or cholesterol levels are high.  You are older than 41 years of age.  You are at high risk for heart disease. What should I know about cancer screening? Depending on your health history and family history, you may need to have cancer screening at various ages. This may include screening for:  Breast cancer.  Cervical cancer.  Colorectal  cancer.  Skin cancer.  Lung cancer. What should I know about heart disease, diabetes, and high blood pressure? Blood pressure and heart disease  High blood pressure causes heart disease and increases the risk of stroke. This is more likely to develop in people who have high blood pressure readings, are of African descent, or are overweight.  Have your blood pressure checked: ? Every 3-5 years if you are 67-34 years of age. ? Every year if you are 108 years old or older. Diabetes Have regular diabetes screenings. This checks your fasting blood sugar level. Have the screening done:  Once every three years after age 57 if you are at a normal weight and have a low risk for diabetes.  More often and at a younger age if you are overweight or have a high risk for diabetes. What should I know about preventing infection? Hepatitis B If you have a higher risk for hepatitis B, you should be screened for this virus. Talk with your health care provider to find out if you are at risk for hepatitis B infection. Hepatitis C Testing is recommended for:  Everyone born from [birth year] through 1963.  Anyone with known risk factors for hepatitis C. Sexually transmitted infections (STIs)  Get screened for STIs, including gonorrhea and chlamydia, if: ? You are sexually active and are younger than 41 years of age. ? You are older than 41 years of age and your health care provider tells you that you are at risk for this type of infection. ? Your sexual activity has changed since you  were last screened, and you are at increased risk for chlamydia or gonorrhea. Ask your health care provider if you are at risk.  Ask your health care provider about whether you are at high risk for HIV. Your health care provider may recommend a prescription medicine to help prevent HIV infection. If you choose to take medicine to prevent HIV, you should first get tested for HIV. You should then be tested every 3 months for as long as  you are taking the medicine. Pregnancy  If you are about to stop having your period (premenopausal) and you may become pregnant, seek counseling before you get pregnant.  Take 400 to 800 micrograms (mcg) of folic acid every day if you become pregnant.  Ask for birth control (contraception) if you want to prevent pregnancy. Osteoporosis and menopause Osteoporosis is a disease in which the bones lose minerals and strength with aging. This can result in bone fractures. If you are 68 years old or older, or if you are at risk for osteoporosis and fractures, ask your health care provider if you should:  Be screened for bone loss.  Take a calcium or vitamin D supplement to lower your risk of fractures.  Be given hormone replacement therapy (HRT) to treat symptoms of menopause. Follow these instructions at home: Lifestyle  Do not use any products that contain nicotine or tobacco, such as cigarettes, e-cigarettes, and chewing tobacco. If you need help quitting, ask your health care provider.  Do not use street drugs.  Do not share needles.  Ask your health care provider for help if you need support or information about quitting drugs. Alcohol use  Do not drink alcohol if: ? Your health care provider tells you not to drink. ? You are pregnant, may be pregnant, or are planning to become pregnant.  If you drink alcohol: ? Limit how much you use to 0-1 drink a day. ? Limit intake if you are breastfeeding.  Be aware of how much alcohol is in your drink. In the [place] one drink equals one 12 oz bottle of beer (355 mL), one 5 oz glass of wine (148 mL), or one 1 oz glass of hard liquor (44 mL). General instructions  Schedule regular health, dental, and eye exams.  Stay current with your vaccines.  Tell your health care provider if: ? You often feel depressed. ? You have ever been abused or do not feel safe at home. Summary  Adopting a healthy lifestyle and getting preventive care are  important in promoting health and wellness.  Follow your health care provider's instructions about healthy diet, exercising, and getting tested or screened for diseases.  Follow your health care provider's instructions on monitoring your cholesterol and blood pressure. This information is not intended to replace advice given to you by your health care provider. Make sure you discuss any questions you have with your health care provider. Document Released: 07/22/2010 Document Revised: 12/30/2017 Document Reviewed: 12/30/2017 Elsevier Patient Education  2020 [employer].

## 2019-01-13 LAB — CBC WITH DIFFERENTIAL/PLATELET
Absolute Monocytes: 366 cells/uL (ref 200–950)
Basophils Absolute: 41 cells/uL (ref 0–200)
Basophils Relative: 0.7 %
Eosinophils Absolute: 159 cells/uL (ref 15–500)
Eosinophils Relative: 2.7 %
HCT: 42.6 % (ref 35.0–45.0)
Hemoglobin: 14.6 g/dL (ref 11.7–15.5)
Lymphs Abs: 1723 cells/uL (ref 850–3900)
MCH: 31.1 pg (ref 27.0–33.0)
MCHC: 34.3 g/dL (ref 32.0–36.0)
MCV: 90.6 fL (ref 80.0–100.0)
MPV: 10.9 fL (ref 7.5–12.5)
Monocytes Relative: 6.2 %
Neutro Abs: 3611 cells/uL (ref 1500–7800)
Neutrophils Relative %: 61.2 %
Platelets: 275 10*3/uL (ref 140–400)
RBC: 4.7 10*6/uL (ref 3.80–5.10)
RDW: 11.8 % (ref 11.0–15.0)
Total Lymphocyte: 29.2 %
WBC: 5.9 10*3/uL (ref 3.8–10.8)

## 2019-01-13 LAB — COMPREHENSIVE METABOLIC PANEL
AG Ratio: 1.6 (calc) (ref 1.0–2.5)
ALT: 27 U/L (ref 6–29)
AST: 26 U/L (ref 10–30)
Albumin: 4.4 g/dL (ref 3.6–5.1)
Alkaline phosphatase (APISO): 39 U/L (ref 31–125)
BUN: 11 mg/dL (ref 7–25)
CO2: 23 mmol/L (ref 20–32)
Calcium: 8.9 mg/dL (ref 8.6–10.2)
Chloride: 104 mmol/L (ref 98–110)
Creat: 0.84 mg/dL (ref 0.50–1.10)
Globulin: 2.7 g/dL (calc) (ref 1.9–3.7)
Glucose, Bld: 90 mg/dL (ref 65–99)
Potassium: 4.3 mmol/L (ref 3.5–5.3)
Sodium: 138 mmol/L (ref 135–146)
Total Bilirubin: 0.6 mg/dL (ref 0.2–1.2)
Total Protein: 7.1 g/dL (ref 6.1–8.1)

## 2019-01-13 LAB — LIPID PANEL
Cholesterol: 196 mg/dL (ref <200)
HDL: 75 mg/dL (ref >=50)
LDL Cholesterol (Calc): 104 mg/dL (calc) — ABNORMAL HIGH
Non-HDL Cholesterol (Calc): 121 mg/dL (calc) (ref <130)
Total CHOL/HDL Ratio: 2.6 (calc) (ref <5.0)
Triglycerides: 78 mg/dL (ref <150)

## 2019-01-20 LAB — PAP, TP IMAGING W/ HPV RNA, RFLX HPV TYPE 16,18/45: HPV DNA High Risk: DETECTED — AB

## 2019-01-20 LAB — HPV TYPE 16 AND 18/45 RNA
HPV Type 16 RNA: NOT DETECTED
HPV Type 18/45 RNA: DETECTED — AB

## 2019-02-03 ENCOUNTER — Other Ambulatory Visit: Payer: Self-pay | Admitting: Women's Health

## 2019-02-03 DIAGNOSIS — Z30011 Encounter for initial prescription of contraceptive pills: Secondary | ICD-10-CM

## 2019-03-11 ENCOUNTER — Other Ambulatory Visit: Payer: Self-pay

## 2019-03-14 ENCOUNTER — Encounter: Payer: Self-pay | Admitting: Obstetrics & Gynecology

## 2019-03-14 ENCOUNTER — Ambulatory Visit (INDEPENDENT_AMBULATORY_CARE_PROVIDER_SITE_OTHER): Payer: 59 | Admitting: Obstetrics & Gynecology

## 2019-03-14 ENCOUNTER — Other Ambulatory Visit: Payer: Self-pay

## 2019-03-14 VITALS — BP 130/84

## 2019-03-14 DIAGNOSIS — R87810 Cervical high risk human papillomavirus (HPV) DNA test positive: Secondary | ICD-10-CM | POA: Diagnosis not present

## 2019-03-14 DIAGNOSIS — N72 Inflammatory disease of cervix uteri: Secondary | ICD-10-CM

## 2019-03-14 NOTE — Patient Instructions (Signed)
1. Cervical high risk HPV (human papillomavirus) test positive Pap test in December 2020 showed high risk HPV positive.  The Pap test was negative.  HPV 18/45 was added and came back positive.  Colposcopy procedure reviewed with patient.  Colposcopy findings discussed.  A biopsy was done at 3:00.  Management per results.  The significance of HPV 18/45+ reviewed with patient.  Gonorrhea and Chlamydia were negative in September 2020.    Other orders - Pathology Report ([facility])  Wille Celeste, it was a pleasure meeting you today!  I will inform you of the results as soon as they are available.

## 2019-03-14 NOTE — Progress Notes (Signed)
    Mary Hamilton [birth year] [id number]        44 y.o.  G2P1011   RP: HPV 18/45 positive for Colposcopy  HPI: HR HPV pos on Pap 12/2018.  HPV 18/45 positive.  Pap tests always Negative in the past, but HR HPV was positive in 12/2015, no Colpo done at that time.  Gono-Chlam Negative 09/2018.     OB History  Gravida Para Term Preterm AB Living  2 1 1   1 1   SAB TAB Ectopic Multiple Live Births               # Outcome Date GA Lbr Len/2nd Weight Sex Delivery Anes PTL Lv  2 AB           1 Term             Past medical history,surgical history, problem list, medications, allergies, family history and social history were all reviewed and documented in the EPIC chart.   Directed ROS with pertinent positives and negatives documented in the history of present illness/assessment and plan.  Exam:  Vitals:   03/14/19 1029  BP: 130/84   General appearance:  Normal  Colposcopy Procedure Note [address] [place] 03/14/2019  Indications:  HPV 18/45 positive  Procedure Details  The risks and benefits of the procedure and Verbal informed consent obtained.  Speculum placed in vagina and excellent visualization of cervix achieved, cervix swabbed x 3 with acetic acid solution.  Findings:  Cervix colposcopy: Physical Exam Genitourinary:       Vaginal colposcopy: Normal  Vulvar colposcopy: Normal  Perirectal colposcopy: Normal  The cervix was sprayed with Hurricane before performing the cervical biopsies.  Specimens: Cervical Bx at 3 O'Clock  Complications: None.  Good hemostasis . Plan:  Management per cervical Bx results.    Assessment/Plan:  44 y.o. G2P1011   1. Cervical high risk HPV (human papillomavirus) test positive Pap test in December 2020 showed high risk HPV positive.  The Pap test was negative.  HPV 18/45 was added and came back positive.  Colposcopy procedure reviewed with patient.  Colposcopy findings discussed.  A biopsy was done at 3:00.  Management per  results.  The significance of HPV 18/45+ reviewed with patient.  Gonorrhea and Chlamydia were negative in September 2020.    Other orders - Pathology Report ([facility])  October 2020 MD, 11:04 AM 03/14/2019

## 2019-03-16 LAB — PATHOLOGY REPORT

## 2019-03-16 LAB — TISSUE SPECIMEN

## 2019-07-18 ENCOUNTER — Other Ambulatory Visit: Payer: Self-pay | Admitting: Nurse Practitioner

## 2019-07-18 ENCOUNTER — Other Ambulatory Visit: Payer: Self-pay | Admitting: *Deleted

## 2019-07-18 DIAGNOSIS — R059 Cough, unspecified: Secondary | ICD-10-CM

## 2019-07-18 MED ORDER — GUAIFENESIN-DM 100-10 MG/5ML PO SYRP: 5.0000 mL | ORAL_SOLUTION | ORAL | 0 refills | Status: AC | PRN

## 2019-07-18 NOTE — Telephone Encounter (Signed)
I sent in Robitussin DM. She can take every 4 hours as needed for cough for up to 7 days.  If she is not already doing so, I recommend taking a daily allergy pill and using nasal spray such as Flonase or Nasacort daily.  If her symptoms persist she will need to be seen.

## 2019-07-18 NOTE — Telephone Encounter (Signed)
Patient called c/o seasonal cough, taking allergy medication which doesn't help much. Report Harriett Sine prescribed Guaifenesin-codeine 100-10mg /68ml and it helps with cough.  Please advise

## 2019-07-18 NOTE — Telephone Encounter (Signed)
Patient informed. 

## 2019-09-12 ENCOUNTER — Other Ambulatory Visit: Payer: Self-pay

## 2019-09-12 ENCOUNTER — Ambulatory Visit (INDEPENDENT_AMBULATORY_CARE_PROVIDER_SITE_OTHER): Payer: 59 | Admitting: Obstetrics & Gynecology

## 2019-09-12 ENCOUNTER — Encounter: Payer: Self-pay | Admitting: Obstetrics & Gynecology

## 2019-09-12 VITALS — BP 128/86

## 2019-09-12 DIAGNOSIS — Z3041 Encounter for surveillance of contraceptive pills: Secondary | ICD-10-CM | POA: Diagnosis not present

## 2019-09-12 DIAGNOSIS — R87810 Cervical high risk human papillomavirus (HPV) DNA test positive: Secondary | ICD-10-CM | POA: Diagnosis not present

## 2019-09-12 DIAGNOSIS — G43829 Menstrual migraine, not intractable, without status migrainosus: Secondary | ICD-10-CM | POA: Diagnosis not present

## 2019-09-12 MED ORDER — RIZATRIPTAN BENZOATE 5 MG PO TABS: 5.0000 mg | ORAL_TABLET | ORAL | 3 refills | Status: DC | PRN

## 2019-09-12 MED ORDER — NORETHIN ACE-ETH ESTRAD-FE 1-20 MG-MCG PO TABS: 1.0000 | ORAL_TABLET | Freq: Every day | ORAL | 2 refills | Status: DC

## 2019-09-12 NOTE — Progress Notes (Signed)
    Mary Hamilton [birth year] [id number]        44 y.o.  G2P1011   RP: Repeat Pap test at 6 months  HPI:  HR HPV pos on Pap 12/2018.  HPV 18/45 positive.  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   OB History  Gravida Para Term Preterm AB Living  2 1 1   1 1   SAB TAB Ectopic Multiple Live Births               # Outcome Date GA Lbr Len/2nd Weight Sex Delivery Anes PTL Lv  2 AB           1 Term             Past medical history,surgical history, problem list, medications, allergies, family history and social history were all reviewed and documented in the EPIC chart.   Directed ROS with pertinent positives and negatives documented in the history of present illness/assessment and plan.  Exam:  Vitals:   09/12/19 0929  BP: 128/86   General appearance:  Normal  Abdomen: Normal  Gynecologic exam: Vulva normal.  Speculum: Cervix/Vagina normal.  Normal secretions.  Repeat Pap test with HPV HR done.   Assessment/Plan:  44 y.o. G2P1011   1. Cervical high risk HPV (human papillomavirus) test positive HPV 18-45 positive.  Colpo 02/2019 Koilocytotic Atypia.  Pap/HPV HR done today.  2. Encounter for surveillance of contraceptive pills Will restart on BCPs to control her cycle and her menstrual migraines.  No CI to BCPs.  The generic of LoEstrin Fe 1/20 prescribed.  3. Menstrual migraine without status migrainosus, not intractable Will control with Low dose BCPs.  Other orders - rizatriptan (MAXALT) 5 MG tablet; Take 1 tablet (5 mg total) by mouth as needed for migraine. May repeat in 2 hours if needed - norethindrone-ethinyl estradiol (LOESTRIN FE 1/20) 1-20 MG-MCG tablet; Take 1 tablet by mouth daily.  2/20 MD, 9:41 AM 09/12/2019

## 2019-09-13 ENCOUNTER — Encounter: Payer: Self-pay | Admitting: Obstetrics & Gynecology

## 2019-09-14 ENCOUNTER — Telehealth: Payer: Self-pay | Admitting: *Deleted

## 2019-09-14 LAB — PAP, TP IMAGING W/ HPV RNA, RFLX HPV TYPE 16,18/45: HPV DNA High Risk: NOT DETECTED

## 2019-09-14 NOTE — Telephone Encounter (Signed)
Patient was seen on 09/12/19 and reports incorrect birth control was sent to the pharmacy. Patient takes LoLoestrin 1/10 mcg tablets. Helps with cycles and headaches she would like to continue on this dose. Okay to send Rx?

## 2019-09-19 MED ORDER — NORETHIN-ETH ESTRAD-FE BIPHAS 1 MG-10 MCG / 10 MCG PO TABS: 1.0000 | ORAL_TABLET | Freq: Every day | ORAL | 3 refills | Status: DC

## 2019-09-19 NOTE — Telephone Encounter (Signed)
Yes, agree with sending LoLoEstrin.

## 2019-09-19 NOTE — Telephone Encounter (Signed)
Left detailed message on patient cell correct Rx has been sent.

## 2020-01-17 ENCOUNTER — Encounter: Payer: Managed Care, Other (non HMO) | Admitting: Nurse Practitioner

## 2020-01-26 ENCOUNTER — Ambulatory Visit (INDEPENDENT_AMBULATORY_CARE_PROVIDER_SITE_OTHER): Payer: BC Managed Care – PPO | Admitting: Obstetrics & Gynecology

## 2020-01-26 ENCOUNTER — Other Ambulatory Visit: Payer: Self-pay

## 2020-01-26 ENCOUNTER — Encounter: Payer: Self-pay | Admitting: Obstetrics & Gynecology

## 2020-01-26 VITALS — BP 124/76 | Ht 65.0 in | Wt 156.0 lb

## 2020-01-26 DIAGNOSIS — R87810 Cervical high risk human papillomavirus (HPV) DNA test positive: Secondary | ICD-10-CM | POA: Diagnosis not present

## 2020-01-26 DIAGNOSIS — R87610 Atypical squamous cells of undetermined significance on cytologic smear of cervix (ASC-US): Secondary | ICD-10-CM | POA: Diagnosis not present

## 2020-01-26 DIAGNOSIS — F419 Anxiety disorder, unspecified: Secondary | ICD-10-CM

## 2020-01-26 DIAGNOSIS — Z01419 Encounter for gynecological examination (general) (routine) without abnormal findings: Secondary | ICD-10-CM

## 2020-01-26 DIAGNOSIS — N6321 Unspecified lump in the left breast, upper outer quadrant: Secondary | ICD-10-CM | POA: Diagnosis not present

## 2020-01-26 DIAGNOSIS — Z3041 Encounter for surveillance of contraceptive pills: Secondary | ICD-10-CM | POA: Diagnosis not present

## 2020-01-26 MED ORDER — DIAZEPAM 5 MG PO TABS: 5.0000 mg | ORAL_TABLET | Freq: Every day | ORAL | 0 refills | Status: DC | PRN

## 2020-01-26 MED ORDER — NORETHIN ACE-ETH ESTRAD-FE 1-20 MG-MCG PO TABS: 1.0000 | ORAL_TABLET | Freq: Every day | ORAL | 4 refills | Status: DC

## 2020-01-26 NOTE — Progress Notes (Signed)
Mary Hamilton Jun 27, 1975 [id number]   History:    45 y.o. G2P1A1 Single.  Daughter is 68 yo.  Works as a Production designer, theatre/television/film in [employer].  RP:  Established patient presenting for annual gyn exam   HPI: Well on Lo-LoEstrin without menses, but feeling more PMS Sx recently, especially low mood.  No major depressive symptoms.  No pelvic pain.  Urine and bowel movements normal.  Body mass index 25.96.  Restarting fitness activities.  Past medical history,surgical history, family history and social history were all reviewed and documented in the EPIC chart.  Gynecologic History No LMP recorded. (Menstrual status: Oral contraceptives).  Obstetric History OB History  Gravida Para Term Preterm AB Living  2 1 1   1 1   SAB IAB Ectopic Multiple Live Births               # Outcome Date GA Lbr Len/2nd Weight Sex Delivery Anes PTL Lv  2 AB           1 Term              ROS: A ROS was performed and pertinent positives and negatives are included in the history.  GENERAL: No fevers or chills. HEENT: No change in vision, no earache, sore throat or sinus congestion. NECK: No pain or stiffness. CARDIOVASCULAR: No chest pain or pressure. No palpitations. PULMONARY: No shortness of breath, cough or wheeze. GASTROINTESTINAL: No abdominal pain, nausea, vomiting or diarrhea, melena or bright red blood per rectum. GENITOURINARY: No urinary frequency, urgency, hesitancy or dysuria. MUSCULOSKELETAL: No joint or muscle pain, no back pain, no recent trauma. DERMATOLOGIC: No rash, no itching, no lesions. ENDOCRINE: No polyuria, polydipsia, no heat or cold intolerance. No recent change in weight. HEMATOLOGICAL: No anemia or easy bruising or bleeding. NEUROLOGIC: No headache, seizures, numbness, tingling or weakness. PSYCHIATRIC: No depression, no loss of interest in normal activity or change in sleep pattern.     Exam:   BP 124/76   Ht 5\' 5"  (1.651 m)   Wt 156 lb (70.8 kg)   BMI 25.96 kg/m   Body mass index  is 25.96 kg/m.  General appearance : Well developed well nourished female. No acute distress HEENT: Eyes: no retinal hemorrhage or exudates,  Neck supple, trachea midline, no carotid bruits, no thyroidmegaly Lungs: Clear to auscultation, no rhonchi or wheezes, or rib retractions  Heart: Regular rate and rhythm, no murmurs or gallops Breast:Examined in sitting and supine position were symmetrical in appearance.  Right breast: No palpable masses or tenderness,  no skin retraction, no nipple inversion, no nipple discharge, no skin discoloration, no axillary or supraclavicular lymphadenopathy.  Left breast with a 2 x 3 cm mass at 1-2 o'clock, mobile, mildly tender.  No skin retraction, no nipple inversion, no nipple discharge, no skin discoloration, no axillary or supraclavicular lymphadenopathy. Abdomen: no palpable masses or tenderness, no rebound or guarding Extremities: no edema or skin discoloration or tenderness  Pelvic: Vulva: Normal             Vagina: No gross lesions or discharge  Cervix: No gross lesions or discharge.  Pap reflex done.  Uterus  AV, normal size, shape and consistency, non-tender and mobile  Adnexa  Without masses or tenderness  Anus: Normal   Assessment/Plan:  45 y.o. female for annual exam   1. Encounter for routine gynecological examination with Papanicolaou smear of cervix  - Pap IG w/ reflex to HPV when ASC-U  2. Cervical high risk HPV (  human papillomavirus) test positive Pap reflex done.  3. Encounter for surveillance of contraceptive pills Progressively worsening PMS Sxs on Lo LoEstrin.  Counseling on PMS done.  Decision to change BCPs to LoEstrin Fe 1/20 generic.  Usage known.  Prescription sent to pharmacy.  4. Breast lump on left side at 1 o'clock position  Left breast with a 2 x 3 cm mass at 1-2 o'clock, mobile, mildly tender.  Referred for a Lt Dx mammo/US.  Will do a Rt Screening mammo.  Other orders - norethindrone-ethinyl estradiol (LOESTRIN FE)  1-20 MG-MCG tablet; Take 1 tablet by mouth daily. - diazepam (VALIUM) 5 MG tablet; Take 1 tablet (5 mg total) by mouth daily as needed for anxiety (For air travels).  Genia Del MD, 10:18 AM 01/26/2020

## 2020-01-27 ENCOUNTER — Telehealth: Payer: Self-pay | Admitting: *Deleted

## 2020-01-27 DIAGNOSIS — N6321 Unspecified lump in the left breast, upper outer quadrant: Secondary | ICD-10-CM

## 2020-01-27 NOTE — Telephone Encounter (Signed)
-----   Message from Genia Del, MD sent at 01/26/2020 10:59 AM EST ----- Regarding: Refer for a Left Dx Mammo/US and Rt screening Mammo Left breast mass at 1-2 O'Clock 2-3 cm, mobile, mildly tender.

## 2020-01-27 NOTE — Telephone Encounter (Signed)
Patient scheduled at the breast center on 03/05/20 @ 1:20pm this is the next available appointment . Patient informed.

## 2020-01-30 ENCOUNTER — Other Ambulatory Visit: Payer: Self-pay

## 2020-01-30 ENCOUNTER — Ambulatory Visit
Admission: RE | Admit: 2020-01-30 | Discharge: 2020-01-30 | Disposition: A | Discharge status: Home / Self Care | Payer: 59 | Source: Ambulatory Visit | Attending: Obstetrics & Gynecology | Admitting: Obstetrics & Gynecology

## 2020-01-30 ENCOUNTER — Ambulatory Visit
Admission: RE | Admit: 2020-01-30 | Discharge: 2020-01-30 | Disposition: A | Discharge status: Home / Self Care | Payer: BC Managed Care – PPO | Source: Ambulatory Visit | Attending: Obstetrics & Gynecology | Admitting: Obstetrics & Gynecology

## 2020-01-30 DIAGNOSIS — N6321 Unspecified lump in the left breast, upper outer quadrant: Secondary | ICD-10-CM

## 2020-01-30 DIAGNOSIS — N6489 Other specified disorders of breast: Secondary | ICD-10-CM | POA: Diagnosis not present

## 2020-01-30 DIAGNOSIS — R922 Inconclusive mammogram: Secondary | ICD-10-CM | POA: Diagnosis not present

## 2020-01-31 LAB — PAP IG W/ RFLX HPV ASCU

## 2020-01-31 LAB — HUMAN PAPILLOMAVIRUS, HIGH RISK: HPV DNA High Risk: NOT DETECTED

## 2020-02-08 ENCOUNTER — Telehealth: Payer: Self-pay | Admitting: *Deleted

## 2020-02-08 NOTE — Telephone Encounter (Signed)
Patient called left message in triage voicemail stating she doesn't want to start the  Loestrin FE 1/20 mcg tablet due to the side effects she read about the medication. Patient said she would rather re-start back on Loestrin Fe 1/10 mcg because this works better with migraines,she asked if coupon is available  to help reduce the cost Loestrin 1/10 mcg tablet. I told patient we do not have coupons here, but she could try Good[facility].  I asked if she needed a new Rx for loestrin 1/10 mcg sent to pharmacy and patient said no, states the pharmacy filled both Rx's Loestrin FE 1/20 and lo Loestrin 1/10 mcg. Patient said she is going to try to find some coupons and get back with me if she needs Rx, she has not started taking either pill yet.

## 2020-03-05 ENCOUNTER — Other Ambulatory Visit: Payer: 59

## 2020-04-20 DIAGNOSIS — W2209XA Striking against other stationary object, initial encounter: Secondary | ICD-10-CM | POA: Diagnosis not present

## 2020-04-20 DIAGNOSIS — R519 Headache, unspecified: Secondary | ICD-10-CM | POA: Diagnosis not present

## 2020-04-20 DIAGNOSIS — R110 Nausea: Secondary | ICD-10-CM | POA: Diagnosis not present

## 2020-04-20 DIAGNOSIS — S0990XA Unspecified injury of head, initial encounter: Secondary | ICD-10-CM | POA: Diagnosis not present

## 2020-04-20 DIAGNOSIS — S060X0A Concussion without loss of consciousness, initial encounter: Secondary | ICD-10-CM | POA: Diagnosis not present

## 2020-07-31 ENCOUNTER — Ambulatory Visit: Payer: BC Managed Care – PPO | Admitting: Obstetrics & Gynecology

## 2020-08-09 DIAGNOSIS — R059 Cough, unspecified: Secondary | ICD-10-CM | POA: Diagnosis not present

## 2020-08-09 DIAGNOSIS — R0789 Other chest pain: Secondary | ICD-10-CM | POA: Diagnosis not present

## 2020-08-09 DIAGNOSIS — R0602 Shortness of breath: Secondary | ICD-10-CM | POA: Diagnosis not present

## 2020-09-05 ENCOUNTER — Ambulatory Visit: Payer: BC Managed Care – PPO | Admitting: Obstetrics & Gynecology

## 2020-10-30 ENCOUNTER — Other Ambulatory Visit (HOSPITAL_COMMUNITY)
Admission: RE | Admit: 2020-10-30 | Discharge: 2020-10-30 | Disposition: A | Discharge status: Home / Self Care | Payer: BC Managed Care – PPO | Source: Ambulatory Visit | Attending: Obstetrics & Gynecology | Admitting: Obstetrics & Gynecology

## 2020-10-30 ENCOUNTER — Encounter: Payer: Self-pay | Admitting: Obstetrics & Gynecology

## 2020-10-30 ENCOUNTER — Other Ambulatory Visit: Payer: Self-pay

## 2020-10-30 ENCOUNTER — Ambulatory Visit (INDEPENDENT_AMBULATORY_CARE_PROVIDER_SITE_OTHER): Payer: BC Managed Care – PPO | Admitting: Obstetrics & Gynecology

## 2020-10-30 VITALS — BP 110/74

## 2020-10-30 DIAGNOSIS — R87810 Cervical high risk human papillomavirus (HPV) DNA test positive: Secondary | ICD-10-CM

## 2020-10-30 DIAGNOSIS — F419 Anxiety disorder, unspecified: Secondary | ICD-10-CM | POA: Diagnosis not present

## 2020-10-30 DIAGNOSIS — R87610 Atypical squamous cells of undetermined significance on cytologic smear of cervix (ASC-US): Secondary | ICD-10-CM

## 2020-10-30 MED ORDER — DIAZEPAM 5 MG PO TABS
5.0000 mg | ORAL_TABLET | Freq: Every day | ORAL | 0 refills | Status: AC | PRN
Start: 2020-10-30 — End: ?

## 2020-10-30 MED ORDER — DIAZEPAM 5 MG PO TABS: 5.0000 mg | ORAL_TABLET | Freq: Every day | ORAL | 0 refills | Status: DC | PRN

## 2020-10-30 NOTE — Progress Notes (Signed)
    Mary Hamilton 1975/07/29 [id number]        45 y.o.  G2P1011   RP: Repeat Pap test  HPI: Had ASCUS on last Pap 01/2020 with Neg HR HPV.  H/O Post HR HPV and koilocytotic atypia on Colpo in 2021. Declines STI screen.  Air travel anxiety, would like a refill of Valium.   OB History  Gravida Para Term Preterm AB Living  2 1 1   1 1   SAB IAB Ectopic Multiple Live Births               # Outcome Date GA Lbr Len/2nd Weight Sex Delivery Anes PTL Lv  2 AB           1 Term             Past medical history,surgical history, problem list, medications, allergies, family history and social history were all reviewed and documented in the EPIC chart.   Directed ROS with pertinent positives and negatives documented in the history of present illness/assessment and plan.  Exam:  There were no vitals filed for this visit. General appearance:  Normal  Abdomen: Normal  Gynecologic exam: Vulva normal.  Speculum:  Cervix/Vagina normal.  Normal vaginal secretions.  Pap reflex done.   Assessment/Plan:  45 y.o. G2P1011   1. ASCUS of cervix with negative high risk HPV History of ASCUS with HPV high-risk negative in January 2022.  Previously had positive high-risk HPV with a colposcopy in 2021 showing koilocytotic atypia.  Repeat Pap test done today.  Patient declines STI screening. - Cytology - PAP( [place])  2. H/O Cervical high risk HPV (human papillomavirus) test positive - Cytology - PAP( [facility])   3. Anxiety Air traveling anxiety.  A refill of Valium sent to pharmacy. - diazepam (VALIUM) 5 MG tablet; Take 1 tablet (5 mg total) by mouth daily as needed for anxiety (For air travels).   2022 MD, 9:41 AM 10/30/2020

## 2020-10-31 LAB — CYTOLOGY - PAP: Diagnosis: NEGATIVE

## 2021-01-28 ENCOUNTER — Ambulatory Visit: Payer: BC Managed Care – PPO | Admitting: Obstetrics & Gynecology

## 2021-09-26 IMAGING — MG DIGITAL DIAGNOSTIC BILAT W/ TOMO W/ CAD
6 of 10 series · 6 of 30 positions shown · non-contrast
Comparison: None.

CLINICAL DATA: 44-year-old presenting with a possible palpable lump
the UPPER OUTER QUADRANT of the LEFT breast which is intermittently
tender and which waxes and wanes. This is the patient's initial
baseline mammogram.

Patient states no family history of breast cancer.
EXAM:
DIGITAL DIAGNOSTIC BILATERAL MAMMOGRAM WITH CAD AND TOMO
ULTRASOUND LEFT BREAST

[L MLO synth-2D]
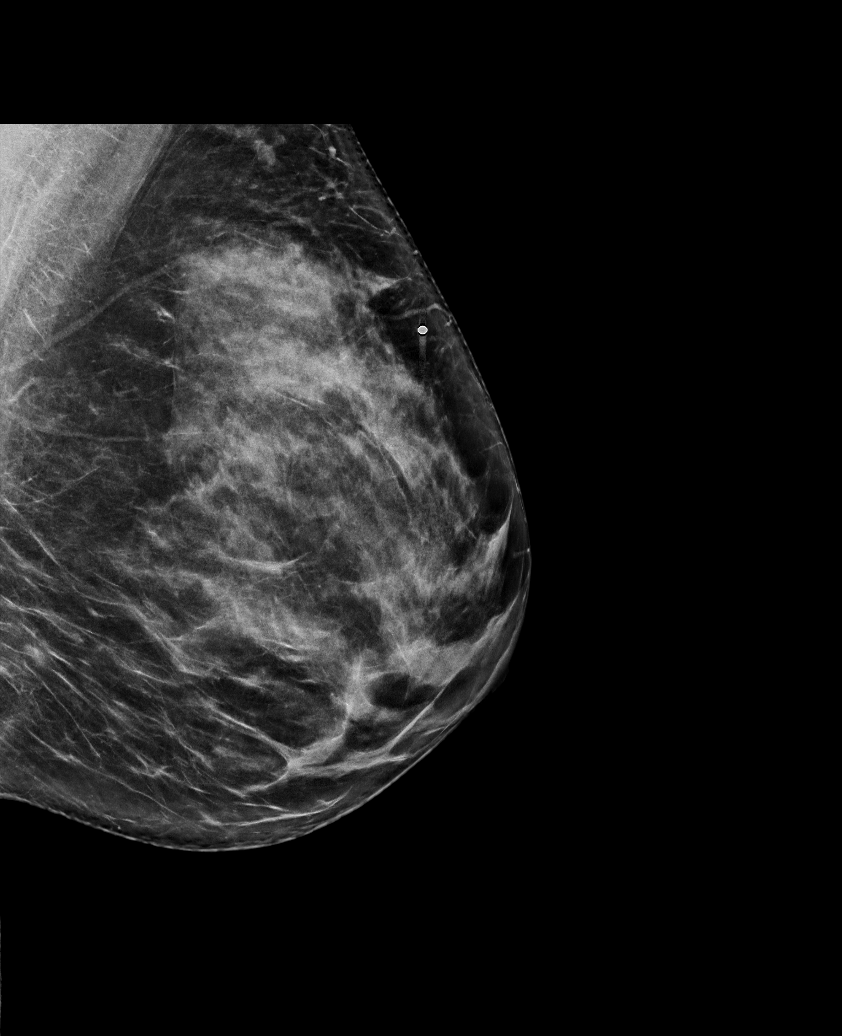

[R MLO synth-2D]
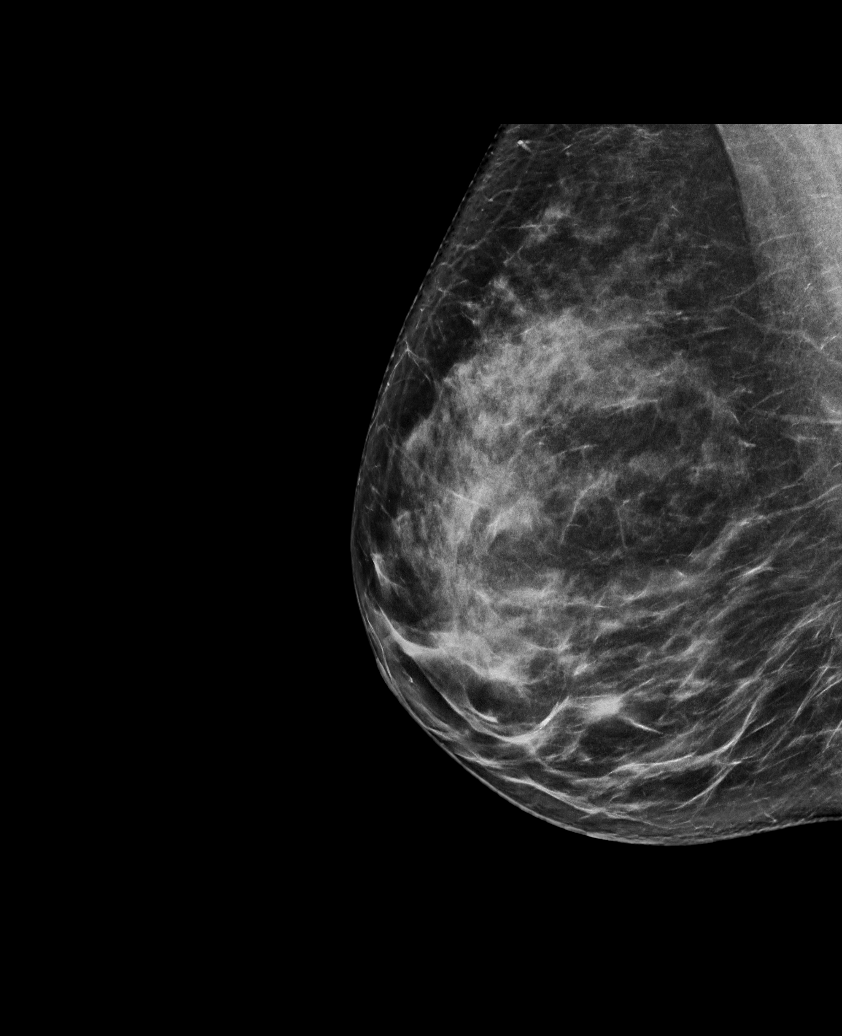

[L TAN synth-2D]
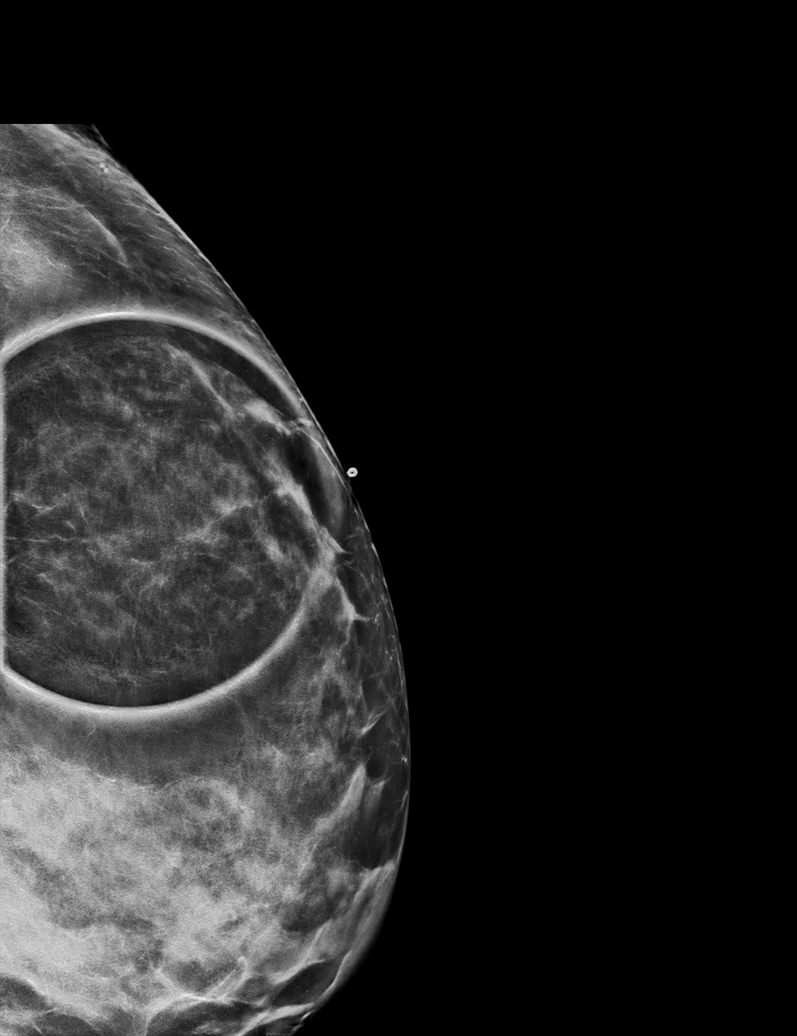

[R CC synth-2D]
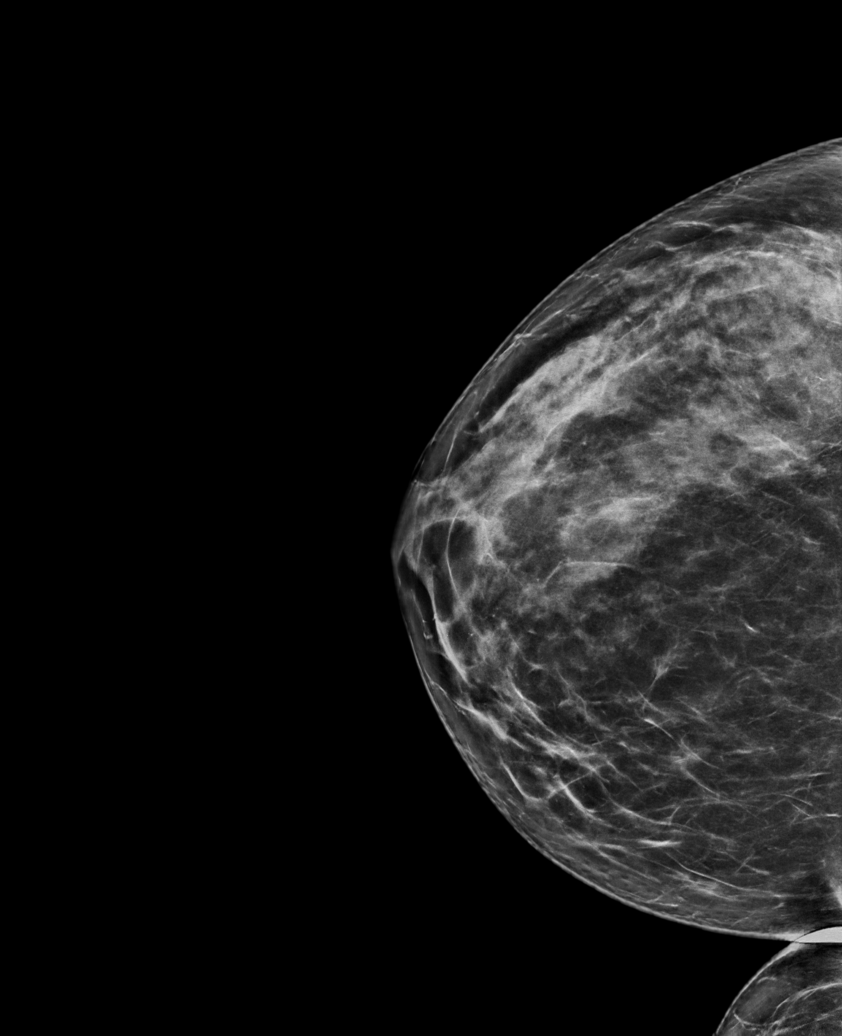

[L CC synth-2D]
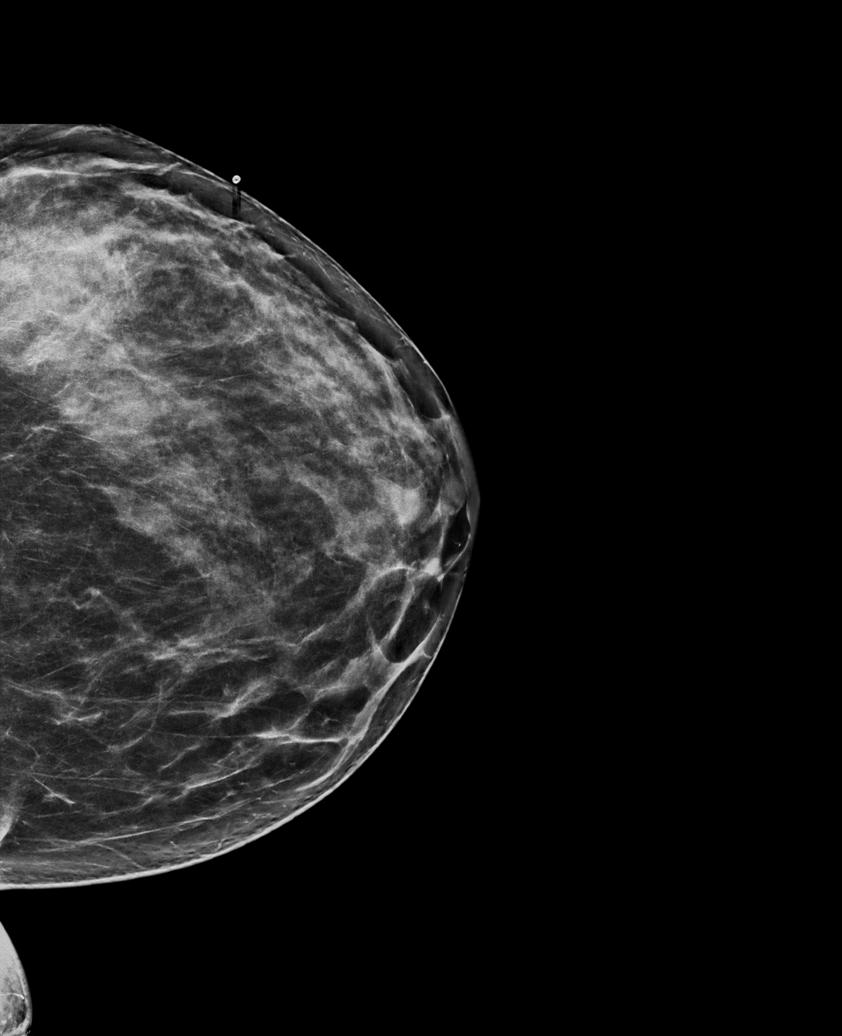

[L MLO tomo · tomo slice 42/83.0]
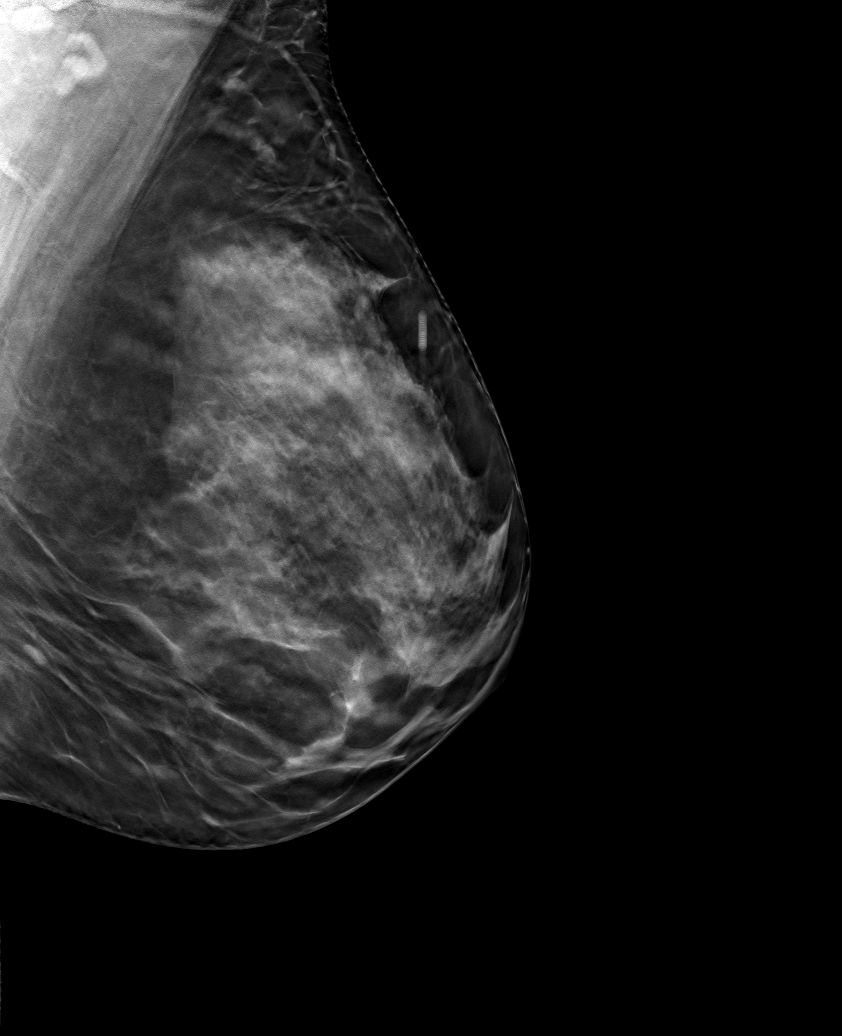

[6 of 30 positions shown; findings below may reference images not displayed]

ACR Breast Density Category c: The breast tissue is heterogeneously
dense, which may obscure small masses.
FINDINGS: Tomosynthesis and synthesized full field CC and MLO views of both
breasts were obtained. Tomosynthesis and synthesized spot
compression tangential view of the area of concern in the LEFT
breast was also obtained. Mammographic images were processed with
CAD.

LEFT: No mammographic abnormality in the area of palpable concern in
the UPPER OUTER QUADRANT on the spot tangential images. Dense
fibroglandular tissue is present in this location.

No findings suspicious for malignancy.

Targeted ultrasound is performed, showing normal dense
fibroglandular tissue extending very close to the skin surface at
the 1 o'clock position approximately 10 cm from the nipple in the
area of palpable concern. No cyst, solid mass or abnormal acoustic
shadowing is identified.

RIGHT: No findings suspicious for malignancy.

On correlative physical examination, there is a mobile palpable
ridge of tissue in the UPPER OUTER QUADRANT of the LEFT breast
corresponding with what the patient is feeling. I do not palpate a
discrete mass.
IMPRESSION: 1. No mammographic or sonographic evidence of malignancy involving
the LEFT breast.
2. No mammographic evidence of malignancy involving the RIGHT
breast.

RECOMMENDATION:
Screening mammogram in one year.(Code:65-7-6S8)

I have discussed the findings and recommendations with the patient.
If applicable, a reminder letter will be sent to the patient
regarding the next appointment.

BI-RADS CATEGORY  1: Negative.
